# Patient Record
Sex: Male | Born: 1960 | Race: Black or African American | Hispanic: No | Marital: Married | Smoking: Never smoker
Health system: Southern US, Community
[De-identification: ages and names within clinical notes are randomized; demographics above are authoritative.]

## PROBLEM LIST (undated history)

## (undated) DIAGNOSIS — F419 Anxiety disorder, unspecified: Secondary | ICD-10-CM

## (undated) DIAGNOSIS — R7303 Prediabetes: Secondary | ICD-10-CM

## (undated) DIAGNOSIS — I10 Essential (primary) hypertension: Secondary | ICD-10-CM

## (undated) DIAGNOSIS — R6882 Decreased libido: Secondary | ICD-10-CM

## (undated) DIAGNOSIS — E785 Hyperlipidemia, unspecified: Secondary | ICD-10-CM

## (undated) HISTORY — DX: Hyperlipidemia, unspecified: E78.5

## (undated) HISTORY — DX: Anxiety disorder, unspecified: F41.9

## (undated) HISTORY — PX: KNEE SURGERY: SHX244

## (undated) HISTORY — PX: SHOULDER SURGERY: SHX246

## (undated) HISTORY — DX: Prediabetes: R73.03

---
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
## 1998-02-18 ENCOUNTER — Encounter: Admission: RE | Admit: 1998-02-18 | Discharge: 1998-02-18 | Payer: Self-pay | Admitting: *Deleted

## 2000-04-24 ENCOUNTER — Other Ambulatory Visit: Admission: RE | Admit: 2000-04-24 | Discharge: 2000-04-24 | Payer: Self-pay | Admitting: Orthopedic Surgery

## 2008-01-31 ENCOUNTER — Emergency Department (HOSPITAL_COMMUNITY): Admission: EM | Admit: 2008-01-31 | Discharge: 2008-02-01 | Payer: Self-pay | Admitting: Emergency Medicine

## 2010-05-18 ENCOUNTER — Inpatient Hospital Stay (INDEPENDENT_AMBULATORY_CARE_PROVIDER_SITE_OTHER)
Admission: RE | Admit: 2010-05-18 | Discharge: 2010-05-18 | Disposition: A | Discharge status: Home / Self Care | Payer: No Typology Code available for payment source | Source: Ambulatory Visit | Attending: Emergency Medicine | Admitting: Emergency Medicine

## 2010-05-18 ENCOUNTER — Ambulatory Visit (INDEPENDENT_AMBULATORY_CARE_PROVIDER_SITE_OTHER): Payer: No Typology Code available for payment source

## 2010-05-18 DIAGNOSIS — Z760 Encounter for issue of repeat prescription: Secondary | ICD-10-CM

## 2010-05-18 DIAGNOSIS — S5000XA Contusion of unspecified elbow, initial encounter: Secondary | ICD-10-CM

## 2010-05-18 DIAGNOSIS — I10 Essential (primary) hypertension: Secondary | ICD-10-CM

## 2010-05-18 DIAGNOSIS — S139XXA Sprain of joints and ligaments of unspecified parts of neck, initial encounter: Secondary | ICD-10-CM

## 2010-11-23 ENCOUNTER — Emergency Department (HOSPITAL_COMMUNITY): Payer: BC Managed Care – PPO

## 2010-11-23 ENCOUNTER — Emergency Department (HOSPITAL_COMMUNITY)
Admission: EM | Admit: 2010-11-23 | Discharge: 2010-11-23 | Disposition: A | Discharge status: Home / Self Care | Payer: BC Managed Care – PPO | Attending: Emergency Medicine | Admitting: Emergency Medicine

## 2010-11-23 DIAGNOSIS — R259 Unspecified abnormal involuntary movements: Secondary | ICD-10-CM | POA: Insufficient documentation

## 2010-11-23 DIAGNOSIS — R002 Palpitations: Secondary | ICD-10-CM | POA: Insufficient documentation

## 2010-11-23 DIAGNOSIS — I10 Essential (primary) hypertension: Secondary | ICD-10-CM | POA: Insufficient documentation

## 2010-11-23 DIAGNOSIS — R42 Dizziness and giddiness: Secondary | ICD-10-CM | POA: Insufficient documentation

## 2010-11-23 LAB — CBC
HCT: 42.2 % (ref 39.0–52.0)
Hemoglobin: 15.4 g/dL (ref 13.0–17.0)
MCV: 89.8 fL (ref 78.0–100.0)
Platelets: 235 10*3/uL (ref 150–400)
RBC: 4.7 MIL/uL (ref 4.22–5.81)
RDW: 12.1 % (ref 11.5–15.5)
WBC: 3.7 10*3/uL — ABNORMAL LOW (ref 4.0–10.5)

## 2010-11-23 LAB — COMPREHENSIVE METABOLIC PANEL
ALT: 16 U/L (ref 0–53)
AST: 21 U/L (ref 0–37)
Albumin: 3.9 g/dL (ref 3.5–5.2)
BUN: 7 mg/dL (ref 6–23)
Calcium: 8.8 mg/dL (ref 8.4–10.5)
Potassium: 3.6 mEq/L (ref 3.5–5.1)
Total Bilirubin: 0.7 mg/dL (ref 0.3–1.2)
Total Protein: 7.7 g/dL (ref 6.0–8.3)

## 2010-11-23 LAB — URINALYSIS, ROUTINE W REFLEX MICROSCOPIC
Leukocytes, UA: NEGATIVE
Urobilinogen, UA: 1 mg/dL (ref 0.0–1.0)

## 2010-11-23 LAB — DIFFERENTIAL
Eosinophils Absolute: 0.1 10*3/uL (ref 0.0–0.7)
Monocytes Absolute: 0.4 10*3/uL (ref 0.1–1.0)
Monocytes Relative: 11 % (ref 3–12)
Neutro Abs: 1.5 10*3/uL — ABNORMAL LOW (ref 1.7–7.7)

## 2010-11-23 LAB — CK TOTAL AND CKMB (NOT AT ARMC)
CK, MB: 1.8 ng/mL (ref 0.3–4.0)
Relative Index: 0.5 (ref 0.0–2.5)
Total CK: 370 U/L — ABNORMAL HIGH (ref 7–232)

## 2010-11-23 LAB — URINE MICROSCOPIC-ADD ON

## 2010-12-21 LAB — URINALYSIS, ROUTINE W REFLEX MICROSCOPIC
Glucose, UA: NEGATIVE
Ketones, ur: NEGATIVE
Leukocytes, UA: NEGATIVE
Protein, ur: NEGATIVE
Specific Gravity, Urine: 1.023
pH: 6

## 2010-12-21 LAB — CBC
MCHC: 34.1
Platelets: 251
RBC: 4.59
RDW: 12.6
WBC: 9.6

## 2010-12-21 LAB — BASIC METABOLIC PANEL
Chloride: 101
Creatinine, Ser: 1.54 — ABNORMAL HIGH
GFR calc Af Amer: 60 — ABNORMAL LOW
Glucose, Bld: 98
Potassium: 3.7

## 2010-12-21 LAB — DIFFERENTIAL
Basophils Relative: 1
Lymphs Abs: 1.4
Neutro Abs: 7.4
Neutrophils Relative %: 77

## 2011-07-26 ENCOUNTER — Ambulatory Visit: Payer: Worker's Compensation | Attending: Specialist | Admitting: Rehabilitative and Restorative Service Providers"

## 2011-07-26 DIAGNOSIS — IMO0001 Reserved for inherently not codable concepts without codable children: Secondary | ICD-10-CM | POA: Insufficient documentation

## 2011-07-26 DIAGNOSIS — M25569 Pain in unspecified knee: Secondary | ICD-10-CM | POA: Insufficient documentation

## 2011-07-26 DIAGNOSIS — M6281 Muscle weakness (generalized): Secondary | ICD-10-CM | POA: Insufficient documentation

## 2011-07-27 ENCOUNTER — Ambulatory Visit: Payer: Worker's Compensation | Admitting: Rehabilitative and Restorative Service Providers"

## 2011-08-02 ENCOUNTER — Ambulatory Visit: Payer: Worker's Compensation | Admitting: Rehabilitative and Restorative Service Providers"

## 2011-08-04 ENCOUNTER — Ambulatory Visit: Payer: Worker's Compensation | Attending: Specialist | Admitting: Physical Therapy

## 2011-08-04 DIAGNOSIS — M6281 Muscle weakness (generalized): Secondary | ICD-10-CM | POA: Insufficient documentation

## 2011-08-04 DIAGNOSIS — M25569 Pain in unspecified knee: Secondary | ICD-10-CM | POA: Insufficient documentation

## 2011-08-04 DIAGNOSIS — IMO0001 Reserved for inherently not codable concepts without codable children: Secondary | ICD-10-CM | POA: Insufficient documentation

## 2011-08-09 ENCOUNTER — Ambulatory Visit: Payer: Worker's Compensation | Attending: Specialist | Admitting: Rehabilitative and Restorative Service Providers"

## 2011-08-09 DIAGNOSIS — M6281 Muscle weakness (generalized): Secondary | ICD-10-CM | POA: Insufficient documentation

## 2011-08-09 DIAGNOSIS — IMO0001 Reserved for inherently not codable concepts without codable children: Secondary | ICD-10-CM | POA: Insufficient documentation

## 2011-08-11 ENCOUNTER — Ambulatory Visit: Payer: Self-pay | Attending: Specialist | Admitting: Rehabilitative and Restorative Service Providers"

## 2011-08-11 DIAGNOSIS — M25569 Pain in unspecified knee: Secondary | ICD-10-CM | POA: Insufficient documentation

## 2011-08-11 DIAGNOSIS — IMO0001 Reserved for inherently not codable concepts without codable children: Secondary | ICD-10-CM | POA: Insufficient documentation

## 2011-08-11 DIAGNOSIS — M6281 Muscle weakness (generalized): Secondary | ICD-10-CM | POA: Insufficient documentation

## 2011-08-18 ENCOUNTER — Ambulatory Visit: Payer: Worker's Compensation | Admitting: Physical Therapy

## 2011-08-23 ENCOUNTER — Ambulatory Visit: Payer: Worker's Compensation | Attending: Specialist | Admitting: Rehabilitative and Restorative Service Providers"

## 2011-08-23 DIAGNOSIS — IMO0001 Reserved for inherently not codable concepts without codable children: Secondary | ICD-10-CM | POA: Insufficient documentation

## 2011-08-23 DIAGNOSIS — M6281 Muscle weakness (generalized): Secondary | ICD-10-CM | POA: Insufficient documentation

## 2011-08-23 DIAGNOSIS — M25569 Pain in unspecified knee: Secondary | ICD-10-CM | POA: Insufficient documentation

## 2011-08-25 ENCOUNTER — Ambulatory Visit: Payer: Worker's Compensation | Admitting: Rehabilitative and Restorative Service Providers"

## 2011-08-30 ENCOUNTER — Encounter: Payer: Self-pay | Admitting: Physical Therapy

## 2011-09-01 ENCOUNTER — Encounter: Payer: Self-pay | Admitting: Physical Therapy

## 2014-10-02 DIAGNOSIS — E1169 Type 2 diabetes mellitus with other specified complication: Secondary | ICD-10-CM | POA: Insufficient documentation

## 2014-10-02 DIAGNOSIS — E785 Hyperlipidemia, unspecified: Secondary | ICD-10-CM

## 2014-10-02 DIAGNOSIS — R7303 Prediabetes: Secondary | ICD-10-CM

## 2014-10-02 HISTORY — DX: Hyperlipidemia, unspecified: E78.5

## 2014-10-02 HISTORY — DX: Prediabetes: R73.03

## 2015-10-12 ENCOUNTER — Telehealth: Payer: Self-pay | Admitting: Internal Medicine

## 2015-10-12 NOTE — Telephone Encounter (Signed)
Patient called and would like his Rx (Norvasc) to be refilled for his blood pressure.  He would like Rx called into [facility] on [address].

## 2015-10-12 NOTE — Telephone Encounter (Signed)
Patient would like his Rx (Norvasc) to be refilled for his blood pressure.  He would like Rx called into [facility] pharmacy on [address].

## 2015-10-13 NOTE — Telephone Encounter (Signed)
I don't see this in the med list. Could it have been so long ago that he is in [place]. If so, he needs an appointment and i'll pull his Jake Samples notes.

## 2015-10-14 ENCOUNTER — Other Ambulatory Visit: Payer: Self-pay

## 2015-10-14 DIAGNOSIS — I10 Essential (primary) hypertension: Secondary | ICD-10-CM

## 2015-10-14 MED ORDER — AMLODIPINE BESYLATE 10 MG PO TABS: 10.0000 mg | ORAL_TABLET | Freq: Every day | ORAL | 0 refills | Status: DC

## 2015-11-16 ENCOUNTER — Ambulatory Visit: Payer: Self-pay | Admitting: Internal Medicine

## 2015-12-31 ENCOUNTER — Emergency Department (HOSPITAL_COMMUNITY)
Admission: EM | Admit: 2015-12-31 | Discharge: 2015-12-31 | Disposition: A | Discharge status: Home / Self Care | Payer: Self-pay | Attending: Emergency Medicine | Admitting: Emergency Medicine

## 2015-12-31 ENCOUNTER — Emergency Department (HOSPITAL_COMMUNITY): Payer: Self-pay

## 2015-12-31 ENCOUNTER — Encounter (HOSPITAL_COMMUNITY): Payer: Self-pay | Admitting: Emergency Medicine

## 2015-12-31 DIAGNOSIS — N289 Disorder of kidney and ureter, unspecified: Secondary | ICD-10-CM | POA: Insufficient documentation

## 2015-12-31 DIAGNOSIS — R079 Chest pain, unspecified: Secondary | ICD-10-CM | POA: Insufficient documentation

## 2015-12-31 DIAGNOSIS — I10 Essential (primary) hypertension: Secondary | ICD-10-CM | POA: Insufficient documentation

## 2015-12-31 HISTORY — DX: Essential (primary) hypertension: I10

## 2015-12-31 LAB — I-STAT CHEM 8, ED
BUN: 7 mg/dL (ref 6–20)
CHLORIDE: 102 mmol/L (ref 101–111)
CREATININE: 1.4 mg/dL — AB (ref 0.61–1.24)
Calcium, Ion: 1.05 mmol/L — ABNORMAL LOW (ref 1.15–1.40)
Glucose, Bld: 115 mg/dL — ABNORMAL HIGH (ref 65–99)
HEMATOCRIT: 40 % (ref 39.0–52.0)
Hemoglobin: 13.6 g/dL (ref 13.0–17.0)
POTASSIUM: 3.8 mmol/L (ref 3.5–5.1)
Sodium: 138 mmol/L (ref 135–145)
TCO2: 25 mmol/L (ref 0–100)

## 2015-12-31 LAB — CBC WITH DIFFERENTIAL/PLATELET
Basophils Absolute: 0 10*3/uL (ref 0.0–0.1)
Basophils Relative: 0 %
EOS PCT: 1 %
Eosinophils Absolute: 0 10*3/uL (ref 0.0–0.7)
HCT: 38.5 % — ABNORMAL LOW (ref 39.0–52.0)
HEMOGLOBIN: 13.9 g/dL (ref 13.0–17.0)
LYMPHS ABS: 2.4 10*3/uL (ref 0.7–4.0)
LYMPHS PCT: 47 %
MCH: 33.3 pg (ref 26.0–34.0)
MCHC: 36.1 g/dL — ABNORMAL HIGH (ref 30.0–36.0)
MCV: 92.1 fL (ref 78.0–100.0)
Monocytes Absolute: 0.6 10*3/uL (ref 0.1–1.0)
Monocytes Relative: 12 %
Neutro Abs: 2.1 10*3/uL (ref 1.7–7.7)
Neutrophils Relative %: 40 %
PLATELETS: 236 10*3/uL (ref 150–400)
RBC: 4.18 MIL/uL — AB (ref 4.22–5.81)
RDW: 12.5 % (ref 11.5–15.5)
WBC: 5.2 10*3/uL (ref 4.0–10.5)

## 2015-12-31 LAB — I-STAT TROPONIN, ED: Troponin i, poc: 0 ng/mL (ref 0.00–0.08)

## 2015-12-31 MED ORDER — AMLODIPINE BESYLATE 10 MG PO TABS: 10.0000 mg | ORAL_TABLET | Freq: Every day | ORAL | 0 refills | Status: DC

## 2015-12-31 NOTE — ED Provider Notes (Signed)
[facility]ER[facility]ENCY DEPT Provider Note   CSN: [id number] Arrival date & time: 12/31/15  [id number]     History   Chief Complaint Chief Complaint  Patient presents with  . Chest Pain    HPI Corey Mcguire is a 55 y.o. male.  HPI Patient presents with right-sided chest pain. States she woke up with a cramping feeling in his right pec muscle. It cramps and then goes away. As no fevers. No shortness of breath. He has not had pains before. Nothing different physically yesterday. No nausea. He has not had anything this before. No history coronary artery disease. He does not smoke. No drug use. Father had heart attacks but it was in his early 6960s.   Past Medical History:  Diagnosis Date  . Hypertension     There are no active problems to display for this patient.   Past Surgical History:  Procedure Laterality Date  . KNEE SURGERY    . SHOULDER SURGERY         Home Medications    Prior to Admission medications   Medication Sig Start Date End Date Taking? Authorizing Provider  amLODipine (NORVASC) 10 MG tablet Take 1 tablet (10 mg total) by mouth daily. 12/31/15   Benjiman CoreNathan Bayden Gil, MD    Family History History reviewed. No pertinent family history.  Social History Social History  Substance Use Topics  . Smoking status: Never Smoker  . Smokeless tobacco: Current User    Types: Chew  . Alcohol use 6.0 - 8.4 oz/week    10 - 14 Cans of beer per week     Allergies   Codeine   Review of Systems Review of Systems  Constitutional: Negative for activity change and appetite change.  Eyes: Negative for pain.  Respiratory: Negative for chest tightness and shortness of breath.   Cardiovascular: Positive for chest pain. Negative for leg swelling.  Gastrointestinal: Negative for abdominal pain, diarrhea, nausea and vomiting.  Genitourinary: Negative for flank pain.  Musculoskeletal: Negative for back pain and neck stiffness.  Skin: Negative for rash.  Neurological: Negative  for weakness, numbness and headaches.  Psychiatric/Behavioral: Negative for behavioral problems.     Physical Exam Updated Vital Signs BP (!) 183/115 (BP Location: Right Arm)   Pulse 76   Temp 98.3 F (36.8 C) (Oral)   Resp 19   Ht 5\' 7"  (1.702 m)   SpO2 100%   Physical Exam  Constitutional: He is oriented to person, place, and time. He appears well-developed and well-nourished.  HENT:  Head: Normocephalic and atraumatic.  Eyes: Pupils are equal, round, and reactive to light.  Neck: Neck supple.  Cardiovascular: Normal rate and regular rhythm.   No murmur heard. Pulmonary/Chest: Effort normal and breath sounds normal. He exhibits no tenderness.  Abdominal: Soft. Bowel sounds are normal. He exhibits no distension and no mass. There is no tenderness. There is no rebound and no guarding.  Musculoskeletal: Normal range of motion. He exhibits no edema.  Neurological: He is alert and oriented to person, place, and time. No cranial nerve deficit.  Skin: Skin is warm and dry.  Psychiatric: He has a normal mood and affect.  Nursing note and vitals reviewed.    ED Treatments / Results  Labs (all labs ordered are listed, but only abnormal results are displayed) Labs Reviewed  CBC WITH DIFFERENTIAL/PLATELET - Abnormal; Notable for the following:       Result Value   RBC 4.18 (*)    HCT 38.5 (*)  MCHC 36.1 (*)    All other components within normal limits  I-STAT CHEM 8, ED - Abnormal; Notable for the following:    Creatinine, Ser 1.40 (*)    Glucose, Bld 115 (*)    Calcium, Ion 1.05 (*)    All other components within normal limits  I-STAT TROPOININ, ED    EKG  EKG Interpretation  Date/Time:  Thursday December 31 2015 06:48:44 EDT Ventricular Rate:  77 PR Interval:    QRS Duration: 79 QT Interval:  379 QTC Calculation: 429 R Axis:   52 Text Interpretation:  Sinus rhythm Borderline T wave abnormalities No significant change since last tracing Reconfirmed by Rubin Payor   MD, Harrold Donath [phone]) on 12/31/2015 7:00:15 AM       Radiology Dg Chest 2 View  Result Date: 12/31/2015 CLINICAL DATA:  Patient with right-sided chest spasms. EXAM: CHEST  2 VIEW COMPARISON:  None. FINDINGS: The heart size and mediastinal contours are within normal limits. Both lungs are clear. The visualized skeletal structures are unremarkable. IMPRESSION: No active cardiopulmonary disease. Electronically Signed   By: Annia Belt M.D.   On: 12/31/2015 07:56    Procedures Procedures (including critical care time)  Medications Ordered in ED Medications - No data to display   Initial Impression / Assessment and Plan / ED Course  I have reviewed the triage vital signs and the nursing notes.  Pertinent labs & imaging results that were available during my care of the patient were reviewed by me and considered in my medical decision making (see chart for details).  Clinical Course  Patient with chest pain. EKG reassuring. Story really not that worrisome. He does however have hypertension has been off his medicines. Some mild renal insufficiency. His been off his medicines for months and I think this is likely chronic and not acute. Will start back on medicines and have follow-up with his primary care doctor. States he has insurance starting the first severe but told to follow-up should be sooner than that. Discharge home.  Final Clinical Impressions(s) / ED Diagnoses   Final diagnoses:  Nonspecific chest pain  Essential hypertension  Renal insufficiency    New Prescriptions New Prescriptions   AMLODIPINE (NORVASC) 10 MG TABLET    Take 1 tablet (10 mg total) by mouth daily.     Benjiman Core, MD 12/31/15 [phone]

## 2015-12-31 NOTE — ED Triage Notes (Signed)
Patient with chest pain that woke him from sleep this am.  He denies any shortness of breath, nausea or vomiting.  No diaphoresis. Patient states that it is in the right chest, no radiation.

## 2016-01-05 ENCOUNTER — Ambulatory Visit (INDEPENDENT_AMBULATORY_CARE_PROVIDER_SITE_OTHER): Payer: Self-pay | Admitting: Internal Medicine

## 2016-01-05 ENCOUNTER — Encounter: Payer: Self-pay | Admitting: Internal Medicine

## 2016-01-05 VITALS — BP 158/96 | HR 78 | Ht 66.5 in | Wt 220.0 lb

## 2016-01-05 DIAGNOSIS — E7800 Pure hypercholesterolemia, unspecified: Secondary | ICD-10-CM

## 2016-01-05 DIAGNOSIS — R7303 Prediabetes: Secondary | ICD-10-CM

## 2016-01-05 DIAGNOSIS — I10 Essential (primary) hypertension: Secondary | ICD-10-CM | POA: Insufficient documentation

## 2016-01-05 DIAGNOSIS — Z23 Encounter for immunization: Secondary | ICD-10-CM

## 2016-01-05 MED ORDER — AMLODIPINE BESYLATE 10 MG PO TABS: 10.0000 mg | ORAL_TABLET | Freq: Every day | ORAL | 11 refills | Status: DC

## 2016-01-05 NOTE — Progress Notes (Signed)
Subjective:    Patient ID: Corey Mcguire, male    DOB: May 18, 1960, 55 y.o.   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   1.  Essential Hypertension:  Out of Amlodipine for 2 months as forgot to sign up for [employer] with new job during enrollment period and cannot do so now until 03/2016.  Seen at ED on 10/12 with right pectoral muscle pain.  EKG and Troponin were fine.  Creatinine was up to 1.4.  BP was not well controlled.  Restarted on  Amlodipine. Lab Results  Component Value Date   CREATININE 1.40 (H) 12/31/2015   CREATININE 1.17 11/23/2010   CREATININE 1.54 (H) 01/31/2008   Missed Amlodipine this morning as was rushing to get grandkids to school. Not stayinig physically active.  2.  Panic Disorder/Anxiety:  No anxiety attacks since working at [employer].  States he has been full time there for past year--this is his second year.  Chart Review of [facility]:  4.  Hypercholesterolemia with very high total of 330,  HDL at 89 and LDL 219. In July of 2016.  Patient did not follow up thereafter to discuss  5.  Prediabetes:  A1C of 5.8% July of 2016, again no follow up  Current Meds  Medication Sig  . amLODipine (NORVASC) 10 MG tablet Take 1 tablet (10 mg total) by mouth daily.  . [DISCONTINUED] amLODipine (NORVASC) 10 MG tablet Take 1 tablet (10 mg total) by mouth daily.   Allergies  Allergen Reactions  . Codeine Hives and Nausea Only    Past Medical History:  Diagnosis Date  . Anxiety    History of panic attacks 2016--improved with employment  . Hyperlipidemia 10/02/2014   [facility]:  Total:  330, Trigs:  109, HDL:  89,  LDL:  219.  did not follow up afterward  . Hypertension   . Prediabetes 10/02/2014   A1C:  5.8%  did not follow up   Past Surgical History:  Procedure Laterality Date  . KNEE SURGERY    . SHOULDER SURGERY      Family History  Problem Relation Age of Onset  .  Hypertension Mother   . Diabetes Father   . Heart disease Father     Quadruple bypass    Social History   Social History  . Marital status: Married    Spouse name: N/A  . Number of children: N/A  . Years of education: college grad   Occupational History  . [employer]    Kindergarten teacher at [employer]   Social History Main Topics  . Smoking status: Never Smoker  . Smokeless tobacco: Current User    Types: Chew     Comment: Chewed tobacco since age 55 yo  . Alcohol use 6.0 - 8.4 oz/week    10 - 14 Cans of beer per week  . Drug use: No  . Sexual activity: Not on file   Other Topics Concern  . Not on file   Social History Narrative   Lives at home with wife      Review of Systems     Objective:   Physical Exam  Weight appears up a bit HEENT:  PERRL, EOMI, throat without injection Neck:  Supple, no adenopathy, no thyromegaly Chest:  CTA CV:  RRR with normal S1 and S2, No S3, S4 or murmur.  No carotid bruits.  Carotid, radial and DP pulses  normal and equal LE:  No edema Abd:  S, NT, No HSM or mass, + BS        Assessment & Plan:  1.  Essential Hypertension:  Patient has redeveloped some renal insufficiency with poor control of BP.   Discussed at length need for consistency with taking Amlodipine daily at about the same time. Discussed mildly abnormal kidney function likely due to poor bp control. Refilled med for year. Encouraged regular physical activity and healthy eating. Followup in 1 month for bp and BMP.  2.  Hypercholesterolemia:  Found this in old chart--will discuss at next visit.  3.  Prediabetes:  Found this in old chart--will discuss at next visit.

## 2016-01-06 ENCOUNTER — Encounter: Payer: Self-pay | Admitting: Internal Medicine

## 2016-02-02 ENCOUNTER — Ambulatory Visit: Payer: Self-pay | Admitting: Internal Medicine

## 2016-02-29 ENCOUNTER — Ambulatory Visit: Payer: Self-pay | Admitting: Internal Medicine

## 2016-12-15 ENCOUNTER — Other Ambulatory Visit: Payer: Self-pay | Admitting: Internal Medicine

## 2017-12-16 ENCOUNTER — Other Ambulatory Visit: Payer: Self-pay | Admitting: Internal Medicine

## 2018-11-12 ENCOUNTER — Encounter: Payer: Self-pay | Admitting: Internal Medicine

## 2018-11-12 ENCOUNTER — Ambulatory Visit: Payer: Self-pay | Admitting: Internal Medicine

## 2018-11-12 ENCOUNTER — Other Ambulatory Visit: Payer: Self-pay

## 2018-11-12 VITALS — BP 110/64 | HR 83 | Temp 98.3°F | Ht 67.0 in | Wt 226.6 lb

## 2018-11-12 DIAGNOSIS — R7989 Other specified abnormal findings of blood chemistry: Secondary | ICD-10-CM | POA: Insufficient documentation

## 2018-11-12 DIAGNOSIS — E559 Vitamin D deficiency, unspecified: Secondary | ICD-10-CM

## 2018-11-12 DIAGNOSIS — R5383 Other fatigue: Secondary | ICD-10-CM | POA: Insufficient documentation

## 2018-11-12 NOTE — Patient Instructions (Addendum)
-   Please stop by the lab one morning between 7:30-8 AM ( fasting )  - Please take over the counter Vitamin D 3 2000 iu daily    - Please discuss fatigue and snoring with your Primary care provider as you are at risk for obstructive sleep apnea

## 2018-11-12 NOTE — Progress Notes (Signed)
Name: Corey Mcguire  MRN/ DOB: [id number], [birth year]    Age/ Sex: 58 y.o., male    PCP: Julieanne MansonMulberry, Elizabeth, MD   Reason for Endocrinology Evaluation: Low testosterone      Date of Initial Endocrinology Evaluation: 11/12/2018     HPI: Corey Mcguire is a 58 y.o. male with a past medical history of HTN, pre-diabetes  . The patient presented for initial endocrinology clinic visit on 11/12/2018 for consultative assistance with his low testosterone    Pt with decreased libido for the past 6 months, denies early morning and middle of night erections, he does endorse well  ED .    Had mumps in his early teens.    Denies decreased shaving  Decreases testicular and penile size over the past month   NO OTC medications   Snores at night ad feels tired.    No gynecomastia   No nipple discharge  Has periodic headaches but no change in vision.    Has been married for 31 yrs Has 3 biological kids 27,28, 2330   He is not a smoker  No FH of prostate cancer    HISTORY:  Past Medical History:  Past Medical History:  Diagnosis Date  . Anxiety    History of panic attacks 2016--improved with employment  . Hyperlipidemia 10/02/2014   [facility]:  Total:  330, Trigs:  109, HDL:  89,  LDL:  219.  did not follow up afterward  . Hypertension   . Prediabetes 10/02/2014   A1C:  5.8%  did not follow up   Past Surgical History:  Past Surgical History:  Procedure Laterality Date  . KNEE SURGERY    . SHOULDER SURGERY        Social History:  reports that he has never smoked. His smokeless tobacco use includes chew. He reports current alcohol use of about 10.0 - 14.0 standard drinks of alcohol per week. He reports that he does not use drugs.  Family History: family history includes Diabetes in his father; Heart disease in his father; Hypertension in his mother.   HOME MEDICATIONS: Allergies as of 11/12/2018      Reactions   Codeine Hives, Nausea Only       Medication List       Accurate as of November 12, 2018  2:24 PM. If you have any questions, ask your nurse or doctor.        amLODipine 10 MG tablet Commonly known as: NORVASC TAKE ONE TABLET BY MOUTH DAILY   metFORMIN 500 MG 24 hr tablet Commonly known as: GLUCOPHAGE-XR   omeprazole 40 MG capsule Commonly known as: PRILOSEC   rosuvastatin 20 MG tablet Commonly known as: CRESTOR   valsartan-hydrochlorothiazide 320-25 MG tablet Commonly known as: DIOVAN-HCT         REVIEW OF SYSTEMS: A comprehensive ROS was conducted with the patient and is negative except as per HPI and below:  Review of Systems  Constitutional: Positive for malaise/fatigue. Negative for fever and weight loss.  HENT: Negative for congestion and sore throat.   Eyes: Negative for blurred vision and pain.  Respiratory: Negative for cough and shortness of breath.   Cardiovascular: Negative for chest pain and palpitations.  Gastrointestinal: Negative for diarrhea and nausea.  Genitourinary: Negative for frequency.  Neurological: Negative for tingling and tremors.  Endo/Heme/Allergies: Negative for polydipsia.  Psychiatric/Behavioral: Negative for depression. The patient has insomnia. The patient is not nervous/anxious.  OBJECTIVE:  VS: BP 110/64 (BP Location: Left Arm, Patient Position: Sitting, Cuff Size: Large)   Pulse 83   Temp 98.3 F (36.8 C)   Ht 5\' 7"  (1.702 m)   Wt 226 lb 9.6 oz (102.8 kg)   SpO2 98%   BMI 35.49 kg/m    Wt Readings from Last 3 Encounters:  11/12/18 226 lb 9.6 oz (102.8 kg)  01/05/16 220 lb (99.8 kg)     EXAM: General: Pt appears well and is in NAD  Hydration: Well-hydrated with moist mucous membranes and good skin turgor  Eyes: External eye exam normal without stare, lid lag or exophthalmos.  EOM intact.  PERRL.  Ears, Nose, Throat: Hearing: Grossly intact bilaterally Dental: Good dentition  Throat: Clear without mass, erythema or exudate  Neck: General:  Supple without adenopathy. Thyroid: Thyroid size normal.   Lungs: Clear with good BS bilat with no rales, rhonchi, or wheezes  Heart: Auscultation: RRR.  Abdomen: Normoactive bowel sounds, soft, nontender, without masses or organomegaly palpable  Genital: Normal penile and testicular exam, Right 15 Ml, left 20 mL   Extremities:  BL LE: No pretibial edema normal ROM and strength.   Skin: Hair: Texture and amount normal with gender appropriate distribution Skin Inspection: No rashes Skin Palpation: Skin temperature, texture, and thickness normal to palpation  Neuro: Cranial nerves: II - XII grossly intact  Motor: Normal strength throughout DTRs: 2+ and symmetric in UE without delay in relaxation phase  Mental Status: Judgment, insight: Intact Orientation: Oriented to time, place, and person Mood and affect: No depression, anxiety, or agitation     DATA REVIEWED: 10/05/2018  13:12    Testosterone 275.6 ng/dL    1/61/096/12/20 6:049:32 AM  540.9308.3 ng/dL    8/11/91475/13/2020  Testosterone 271.2 ng/dL     TSH 8.291.09 uIU/mL    Vitamin D 5.3 ng/mL   ASSESSMENT/PLAN/RECOMMENDATIONS:   1. Decreased Libido :   - Differential diagnosis could be endocrine vs psychological, erectile dysfunction could be endocrine, psychological vs vascular.  - We discussed the Endocrine Society guidelines in the proper way of checking testosterone, pt has to be fasting and will present for testing at 8 AM. - We discussed side effects of testosterone therapy such as erythropoiesis, worsening of sleep apnea that is severe and untreated,  Prostate volumes and serum PSA increase in response to testosterone treatment which might increase BPH and worsen urinary outflow obstruction as well as prostate cancer risk.  - We also discussed there is a possibility of increased cardiovascular risk associated with testosterone use.    2. Vitamin D Deficiency :   - We discussed the role of vitamin D in muscle and bone health, we  discussed how it could improve energy. Pt at risk for osteomalacia if not properly treated, he took replacement for one month but stopped when the prescription ran out.   - Pt advised to take OTC Vit D3 2000 iu daily and have that rechecked in 3 months at [facility].    3. Fatigue :  - Multifactorial - Given obesity, snoring and fatigue, he is at a high risk for OSA, I have encouraged him to address this with his PCP.  - We discussed the risk of CVD, and HTN with untreated OSA    F/U pending lab results.   Signed electronically by: Lyndle HerrlichAbby Jaralla Georgean Spainhower, MD  [facility]  [facility] [address] [place], [place] [address] Phone: [phone] FAX: [phone]  CC: Mack Hook, MD [place] [address] Phone: [phone] Fax: [phone]   Return to Endocrinology clinic as below: No future appointments.

## 2018-11-13 ENCOUNTER — Encounter: Payer: Self-pay | Admitting: Internal Medicine

## 2018-11-14 ENCOUNTER — Other Ambulatory Visit: Payer: Self-pay | Admitting: Internal Medicine

## 2018-11-14 ENCOUNTER — Other Ambulatory Visit: Payer: Self-pay

## 2018-11-14 ENCOUNTER — Telehealth: Payer: Self-pay | Admitting: Internal Medicine

## 2018-11-14 ENCOUNTER — Other Ambulatory Visit (INDEPENDENT_AMBULATORY_CARE_PROVIDER_SITE_OTHER): Payer: BC Managed Care – PPO

## 2018-11-14 DIAGNOSIS — R7989 Other specified abnormal findings of blood chemistry: Secondary | ICD-10-CM

## 2018-11-14 LAB — CBC WITH DIFFERENTIAL/PLATELET
Basophils Absolute: 0 10*3/uL (ref 0.0–0.1)
Basophils Relative: 0.3 % (ref 0.0–3.0)
Eosinophils Absolute: 0.1 10*3/uL (ref 0.0–0.7)
Eosinophils Relative: 0.7 % (ref 0.0–5.0)
HCT: 32.6 % — ABNORMAL LOW (ref 39.0–52.0)
Hemoglobin: 11.6 g/dL — ABNORMAL LOW (ref 13.0–17.0)
Lymphocytes Relative: 38.1 % (ref 12.0–46.0)
Lymphs Abs: 3.3 10*3/uL (ref 0.7–4.0)
MCHC: 35.6 g/dL (ref 30.0–36.0)
MCV: 92.9 fl (ref 78.0–100.0)
Monocytes Absolute: 1 10*3/uL (ref 0.1–1.0)
Monocytes Relative: 11.4 % (ref 3.0–12.0)
Neutro Abs: 4.3 10*3/uL (ref 1.4–7.7)
Neutrophils Relative %: 49.5 % (ref 43.0–77.0)
Platelets: 321 10*3/uL (ref 150.0–400.0)
RBC: 3.51 Mil/uL — ABNORMAL LOW (ref 4.22–5.81)
RDW: 14.2 % (ref 11.5–15.5)
WBC: 8.6 10*3/uL (ref 4.0–10.5)

## 2018-11-14 LAB — COMPREHENSIVE METABOLIC PANEL
ALT: 24 U/L (ref 0–53)
AST: 36 U/L (ref 0–37)
Albumin: 4.2 g/dL (ref 3.5–5.2)
Alkaline Phosphatase: 53 U/L (ref 39–117)
BUN: 10 mg/dL (ref 6–23)
CO2: 38 mEq/L — ABNORMAL HIGH (ref 19–32)
Calcium: 9.2 mg/dL (ref 8.4–10.5)
Chloride: 88 mEq/L — ABNORMAL LOW (ref 96–112)
Creatinine, Ser: 2.07 mg/dL — ABNORMAL HIGH (ref 0.40–1.50)
GFR: 40.44 mL/min — ABNORMAL LOW (ref >60.00)
Glucose, Bld: 165 mg/dL — ABNORMAL HIGH (ref 70–99)
Potassium: 2.6 mEq/L — CL (ref 3.5–5.1)
Sodium: 139 mEq/L (ref 135–145)
Total Bilirubin: 1.1 mg/dL (ref 0.2–1.2)
Total Protein: 7.3 g/dL (ref 6.0–8.3)

## 2018-11-14 LAB — PSA: PSA: 0.96 ng/mL (ref 0.10–4.00)

## 2018-11-14 LAB — LUTEINIZING HORMONE: LH: 8.64 m[IU]/mL (ref 1.50–9.30)

## 2018-11-14 LAB — TSH: TSH: 1.31 u[IU]/mL (ref 0.35–4.50)

## 2018-11-14 LAB — T4, FREE: Free T4: 1.34 ng/dL (ref 0.60–1.60)

## 2018-11-14 LAB — FOLLICLE STIMULATING HORMONE: FSH: 7 m[IU]/mL (ref 1.4–18.1)

## 2018-11-14 MED ORDER — POTASSIUM CHLORIDE ER 10 MEQ PO CPCR: 10.0000 meq | ORAL_CAPSULE | Freq: Two times a day (BID) | ORAL | 0 refills | Status: DC

## 2018-11-14 NOTE — Telephone Encounter (Signed)
Pt aware of results and stated that he would pick up medication and make f/u with PCP

## 2018-11-14 NOTE — Telephone Encounter (Signed)
Results were faxed conformation recieved

## 2018-11-14 NOTE — Telephone Encounter (Signed)
Please let him know his potassium is very low, this is most likely coming for his water pill (Hydrochlorothiazide)   I am still waiting on testosterone results, this will take 10 days or so  Please let him know that I sent a prescription of potassium tablets to be taken twice a day for 1 week , he needs to schedule an appointment with his PCP ASAP for follow up on the potassium and to discuss medication change.    He is anemic, his kidney function is low ( but I am not clear if this is his baseline or not ) Please fax the labs to his PCP .     Thank you   Abby Nena Jordan, MD  [facility]  [facility] [address] [place], [place] [address] Phone: [phone] FAX: [phone]

## 2018-11-17 LAB — PROLACTIN: Prolactin: 13 ng/mL (ref 2.0–18.0)

## 2018-11-17 LAB — TESTOSTERONE, TOTAL, LC/MS/MS: Testosterone, Total, LC-MS-MS: 298 ng/dL (ref 250–1100)

## 2018-11-20 ENCOUNTER — Encounter: Payer: Self-pay | Admitting: Internal Medicine

## 2018-11-20 LAB — TESTOSTERONE FREE MS/DIALYSIS
Free Testosterone, Serum: 67 pg/mL
Testosterone, Serum (Total): 291 ng/dL
Testosterone-% Free: 2.3 %

## 2018-11-26 ENCOUNTER — Other Ambulatory Visit: Payer: Self-pay | Admitting: Internal Medicine

## 2018-11-27 NOTE — Telephone Encounter (Signed)
You gave pt 15 day supply,is this something you want him for longer? Please advise

## 2018-12-26 ENCOUNTER — Encounter: Payer: Self-pay | Admitting: Internal Medicine

## 2018-12-26 DIAGNOSIS — R6882 Decreased libido: Secondary | ICD-10-CM | POA: Insufficient documentation

## 2018-12-26 HISTORY — DX: Decreased libido: R68.82

## 2019-04-07 ENCOUNTER — Other Ambulatory Visit: Payer: Self-pay

## 2019-04-07 ENCOUNTER — Emergency Department (HOSPITAL_COMMUNITY)
Admission: EM | Admit: 2019-04-07 | Discharge: 2019-04-08 | Disposition: A | Discharge status: Home / Self Care | Payer: BC Managed Care – PPO | Attending: Emergency Medicine | Admitting: Emergency Medicine

## 2019-04-07 ENCOUNTER — Encounter (HOSPITAL_COMMUNITY): Payer: Self-pay | Admitting: *Deleted

## 2019-04-07 DIAGNOSIS — I10 Essential (primary) hypertension: Secondary | ICD-10-CM | POA: Insufficient documentation

## 2019-04-07 DIAGNOSIS — Z79899 Other long term (current) drug therapy: Secondary | ICD-10-CM | POA: Diagnosis not present

## 2019-04-07 DIAGNOSIS — N179 Acute kidney failure, unspecified: Secondary | ICD-10-CM | POA: Insufficient documentation

## 2019-04-07 DIAGNOSIS — Z720 Tobacco use: Secondary | ICD-10-CM | POA: Insufficient documentation

## 2019-04-07 DIAGNOSIS — K219 Gastro-esophageal reflux disease without esophagitis: Secondary | ICD-10-CM | POA: Insufficient documentation

## 2019-04-07 DIAGNOSIS — R112 Nausea with vomiting, unspecified: Secondary | ICD-10-CM

## 2019-04-07 DIAGNOSIS — R1110 Vomiting, unspecified: Secondary | ICD-10-CM | POA: Diagnosis present

## 2019-04-07 NOTE — ED Triage Notes (Signed)
2112   The pt is here for high blood pressure all day with some vomiting also

## 2019-04-08 ENCOUNTER — Other Ambulatory Visit: Payer: Self-pay

## 2019-04-08 LAB — CBC WITH DIFFERENTIAL/PLATELET
Abs Immature Granulocytes: 0.04 10*3/uL (ref 0.00–0.07)
Basophils Absolute: 0 10*3/uL (ref 0.0–0.1)
Basophils Relative: 0 %
Eosinophils Absolute: 0 10*3/uL (ref 0.0–0.5)
Eosinophils Relative: 0 %
HCT: 36.2 % — ABNORMAL LOW (ref 39.0–52.0)
Hemoglobin: 12.6 g/dL — ABNORMAL LOW (ref 13.0–17.0)
Immature Granulocytes: 0 %
Lymphocytes Relative: 23 %
Lymphs Abs: 2.2 10*3/uL (ref 0.7–4.0)
MCH: 32.6 pg (ref 26.0–34.0)
MCHC: 34.8 g/dL (ref 30.0–36.0)
MCV: 93.8 fL (ref 80.0–100.0)
Monocytes Absolute: 0.9 10*3/uL (ref 0.1–1.0)
Monocytes Relative: 9 %
Neutro Abs: 6.7 10*3/uL (ref 1.7–7.7)
Neutrophils Relative %: 68 %
Platelets: 299 10*3/uL (ref 150–400)
RBC: 3.86 MIL/uL — ABNORMAL LOW (ref 4.22–5.81)
RDW: 13.2 % (ref 11.5–15.5)
WBC: 9.9 10*3/uL (ref 4.0–10.5)
nRBC: 0 % (ref 0.0–0.2)

## 2019-04-08 LAB — BASIC METABOLIC PANEL
Anion gap: 13 (ref 5–15)
BUN: 14 mg/dL (ref 6–20)
CO2: 23 mmol/L (ref 22–32)
Calcium: 9.5 mg/dL (ref 8.9–10.3)
Chloride: 101 mmol/L (ref 98–111)
Creatinine, Ser: 2.26 mg/dL — ABNORMAL HIGH (ref 0.61–1.24)
GFR calc Af Amer: 36 mL/min — ABNORMAL LOW (ref >60)
GFR calc non Af Amer: 31 mL/min — ABNORMAL LOW (ref >60)
Glucose, Bld: 120 mg/dL — ABNORMAL HIGH (ref 70–99)
Potassium: 4 mmol/L (ref 3.5–5.1)
Sodium: 137 mmol/L (ref 135–145)

## 2019-04-08 MED ORDER — ALUM & MAG HYDROXIDE-SIMETH 200-200-20 MG/5ML PO SUSP
30.0000 mL | Freq: Once | ORAL | Status: AC
  Administered 2019-04-08: 04:00:00 30 mL via ORAL
  Filled 2019-04-08: qty 30

## 2019-04-08 MED ORDER — SODIUM CHLORIDE 0.9 % IV BOLUS
1000.0000 mL | Freq: Once | INTRAVENOUS | Status: AC
  Administered 2019-04-08: 04:00:00 1000 mL via INTRAVENOUS

## 2019-04-08 NOTE — ED Provider Notes (Signed)
[facility] EMERGENCY DEPARTMENT Provider Note  CSN: [id number] Arrival date & time: 04/07/19 2111  Chief Complaint(s) Hypertension  HPI Corey Mcguire is a 59 y.o. male   The history is provided by the patient.  Emesis Severity:  Moderate Duration:  2 hours Number of daily episodes:  2 Quality:  Stomach contents Progression:  Resolved Chronicity:  New Relieved by:  Nothing Worsened by:  Nothing Associated symptoms: no abdominal pain, no chills, no cough, no diarrhea, no fever, no headaches, no myalgias, no sore throat and no URI   Risk factors: no alcohol use, no sick contacts and no suspect food intake     Patient reports that he noted his BP elevated to 200s after this episode.  Past Medical History Past Medical History:  Diagnosis Date  . Anxiety    History of panic attacks 2016--improved with employment  . Hyperlipidemia 10/02/2014   Labs in [facility]:  Total:  330, Trigs:  109, HDL:  89,  LDL:  219.  did not follow up afterward  . Hypertension   . Libido, decreased 12/26/2018  . Prediabetes 10/02/2014   A1C:  5.8%  did not follow up   Patient Active Problem List   Diagnosis Date Noted  . Libido, decreased 12/26/2018  . Vitamin D deficiency 11/12/2018  . Fatigue 11/12/2018  . Low testosterone in male 11/12/2018  . Essential hypertension 01/05/2016  . Prediabetes 10/02/2014  . Hyperlipidemia 10/02/2014   Home Medication(s) Prior to Admission medications   Medication Sig Start Date End Date Taking? Authorizing Provider  amLODipine (NORVASC) 10 MG tablet TAKE ONE TABLET BY MOUTH DAILY 12/16/16   Julieanne Manson, MD  metFORMIN (GLUCOPHAGE-XR) 500 MG 24 hr tablet  09/05/18   [provider]  omeprazole (PRILOSEC) 40 MG capsule  10/24/18   [provider]  potassium chloride (MICRO-K) 10 MEQ CR capsule Take 1 capsule (10 mEq total) by mouth 2 (two) times daily. 11/14/18   Shamleffer, Konrad Dolores, MD  rosuvastatin  (CRESTOR) 20 MG tablet  08/18/18   [provider]  valsartan-hydrochlorothiazide (DIOVAN-HCT) 320-25 MG tablet  08/01/18   [provider]                                                                                                                                    Past Surgical History Past Surgical History:  Procedure Laterality Date  . KNEE SURGERY    . SHOULDER SURGERY     Family History Family History  Problem Relation Age of Onset  . Hypertension Mother   . Diabetes Father   . Heart disease Father        Quadruple bypass    Social History Social History   Tobacco Use  . Smoking status: Never Smoker  . Smokeless tobacco: Current User    Types: Chew  . Tobacco comment: Chewed tobacco since age 39 yo  Substance Use Topics  .  Alcohol use: Yes    Alcohol/week: 10.0 - 14.0 standard drinks    Types: 10 - 14 Cans of beer per week  . Drug use: No   Allergies Codeine  Review of Systems Review of Systems  Constitutional: Negative for chills and fever.  HENT: Negative for sore throat.   Respiratory: Negative for cough.   Gastrointestinal: Positive for vomiting. Negative for abdominal pain and diarrhea.  Musculoskeletal: Negative for myalgias.  Neurological: Negative for headaches.   All other systems are reviewed and are negative for acute change except as noted in the HPI  Physical Exam Vital Signs  I have reviewed the triage vital signs BP (!) 156/86   Pulse 83   Temp 98.1 F (36.7 C)   Resp 18   Ht 5\' 7"  (1.702 m)   Wt 99.8 kg   SpO2 99%   BMI 34.46 kg/m   Physical Exam Vitals reviewed.  Constitutional:      General: He is not in acute distress.    Appearance: He is well-developed. He is not diaphoretic.  HENT:     Head: Normocephalic and atraumatic.     Nose: Nose normal.  Eyes:     General: No scleral icterus.       Right eye: No discharge.        Left eye: No discharge.     Conjunctiva/sclera: Conjunctivae normal.      Pupils: Pupils are equal, round, and reactive to light.  Cardiovascular:     Rate and Rhythm: Normal rate and regular rhythm.     Heart sounds: No murmur. No friction rub. No gallop.   Pulmonary:     Effort: Pulmonary effort is normal. No respiratory distress.     Breath sounds: Normal breath sounds. No stridor. No rales.  Abdominal:     General: There is no distension.     Palpations: Abdomen is soft.     Tenderness: There is no abdominal tenderness.  Musculoskeletal:        General: No tenderness.     Cervical back: Normal range of motion and neck supple.  Skin:    General: Skin is warm and dry.     Findings: No erythema or rash.  Neurological:     Mental Status: He is alert and oriented to person, place, and time.     ED Results and Treatments Labs (all labs ordered are listed, but only abnormal results are displayed) Labs Reviewed  CBC WITH DIFFERENTIAL/PLATELET - Abnormal; Notable for the following components:      Result Value   RBC 3.86 (*)    Hemoglobin 12.6 (*)    HCT 36.2 (*)    All other components within normal limits  BASIC METABOLIC PANEL - Abnormal; Notable for the following components:   Glucose, Bld 120 (*)    Creatinine, Ser 2.26 (*)    GFR calc non Af Amer 31 (*)    GFR calc Af Amer 36 (*)    All other components within normal limits  EKG  EKG Interpretation  Date/Time:  Monday April 08 2019 03:17:09 EST Ventricular Rate:  92 PR Interval:    QRS Duration: 77 QT Interval:  330 QTC Calculation: 409 R Axis:   86 Text Interpretation: Sinus rhythm No significant change since last tracing Confirmed by Drema Pry [phone]) on 04/08/2019 4:07:46 AM      Radiology No results found.  Pertinent labs & imaging results that were available during my care of the patient were reviewed by me and considered in my medical decision making  (see chart for details).  Medications Ordered in ED Medications  sodium chloride 0.9 % bolus 1,000 mL (0 mLs Intravenous Stopped 04/08/19 0543)  alum & mag hydroxide-simeth (MAALOX/MYLANTA) 200-200-20 MG/5ML suspension 30 mL (30 mLs Oral Given 04/08/19 0343)                                                                                                                                    Procedures Procedures  (including critical care time)  Medical Decision Making / ED Course I have reviewed the nursing notes for this encounter and the patient's prior records (if available in EHR or on provided paperwork).   Corey Mcguire was evaluated in Emergency Department on 04/08/2019 for the symptoms described in the history of present illness. He was evaluated in the context of the global COVID-19 pandemic, which necessitated consideration that the patient might be at risk for infection with the SARS-CoV-2 virus that causes COVID-19. Institutional protocols and algorithms that pertain to the evaluation of patients at risk for COVID-19 are in a state of rapid change based on information released by regulatory bodies including the CDC and federal and state organizations. These policies and algorithms were followed during the patient's care in the ED.  Postprandial emesis. Abdomen benign.  Abscess reassuring without leukocytosis or significant anemia.  No significant electrolyte derangements.  Patient does have mild renal insufficiency compared to most recent BMP.  Patient treated with GI cocktail which provided good relief. Given IVF bolus.  Low suspicion for serious intra-abdominal premature/infectious process or bowel obstruction requiring imaging at this time.  Patient has been able to tolerate oral intake.  EKG without acute ischemic changes, doubt cardiac etiology.  The patient appears reasonably screened and/or stabilized for discharge and I doubt any other medical condition or other [facility]  requiring further screening, evaluation, or treatment in the ED at this time prior to discharge.  The patient is safe for discharge with strict return precautions.       Final Clinical Impression(s) / ED Diagnoses Final diagnoses:  Gastroesophageal reflux disease, unspecified whether esophagitis present  Nausea and vomiting in adult  AKI (acute kidney injury) (HCC)    The patient appears reasonably screened and/or stabilized for discharge and I doubt any other medical condition or other [facility] requiring further screening, evaluation, or treatment in the ED at this time prior to discharge.  Disposition: Discharge  Condition: Good  I have discussed the results, Dx and Tx plan with the patient who expressed understanding and agree(s) with the plan. Discharge instructions discussed at great length. The patient was given strict return precautions who verbalized understanding of the instructions. No further questions at time of discharge.    ED Discharge Orders    None      Follow Up: Wilfrid Lund, PA [address] [place] [address] [phone]   in 5-7 days for repeat renal function test      This chart was dictated using voice recognition software.  Despite best efforts to proofread,  errors can occur which can change the documentation meaning.   Nira Conn, MD 04/08/19 [phone]

## 2019-06-11 ENCOUNTER — Other Ambulatory Visit: Payer: Self-pay | Admitting: Nephrology

## 2019-06-11 DIAGNOSIS — N1832 Chronic kidney disease, stage 3b: Secondary | ICD-10-CM

## 2019-06-12 ENCOUNTER — Ambulatory Visit
Admission: RE | Admit: 2019-06-12 | Discharge: 2019-06-12 | Disposition: A | Discharge status: Home / Self Care | Payer: BC Managed Care – PPO | Source: Ambulatory Visit | Attending: Nephrology | Admitting: Nephrology

## 2019-06-12 DIAGNOSIS — N1832 Chronic kidney disease, stage 3b: Secondary | ICD-10-CM

## 2019-06-17 ENCOUNTER — Other Ambulatory Visit (HOSPITAL_COMMUNITY): Payer: Self-pay | Admitting: Nephrology

## 2019-06-17 DIAGNOSIS — R809 Proteinuria, unspecified: Secondary | ICD-10-CM

## 2019-06-17 DIAGNOSIS — N059 Unspecified nephritic syndrome with unspecified morphologic changes: Secondary | ICD-10-CM

## 2019-06-17 DIAGNOSIS — N1832 Chronic kidney disease, stage 3b: Secondary | ICD-10-CM

## 2019-06-21 ENCOUNTER — Other Ambulatory Visit: Payer: Self-pay | Admitting: Student

## 2019-06-21 ENCOUNTER — Other Ambulatory Visit: Payer: Self-pay | Admitting: Radiology

## 2019-06-24 ENCOUNTER — Other Ambulatory Visit: Payer: Self-pay

## 2019-06-24 ENCOUNTER — Encounter (HOSPITAL_COMMUNITY): Payer: Self-pay

## 2019-06-24 ENCOUNTER — Ambulatory Visit (HOSPITAL_COMMUNITY)
Admission: RE | Admit: 2019-06-24 | Discharge: 2019-06-24 | Disposition: A | Discharge status: Home / Self Care | Payer: BC Managed Care – PPO | Source: Ambulatory Visit | Attending: Nephrology | Admitting: Nephrology

## 2019-06-24 DIAGNOSIS — N059 Unspecified nephritic syndrome with unspecified morphologic changes: Secondary | ICD-10-CM

## 2019-06-24 DIAGNOSIS — Z79899 Other long term (current) drug therapy: Secondary | ICD-10-CM | POA: Diagnosis not present

## 2019-06-24 DIAGNOSIS — Z7984 Long term (current) use of oral hypoglycemic drugs: Secondary | ICD-10-CM | POA: Insufficient documentation

## 2019-06-24 DIAGNOSIS — I129 Hypertensive chronic kidney disease with stage 1 through stage 4 chronic kidney disease, or unspecified chronic kidney disease: Secondary | ICD-10-CM | POA: Insufficient documentation

## 2019-06-24 DIAGNOSIS — N183 Chronic kidney disease, stage 3 unspecified: Secondary | ICD-10-CM | POA: Insufficient documentation

## 2019-06-24 DIAGNOSIS — F419 Anxiety disorder, unspecified: Secondary | ICD-10-CM | POA: Insufficient documentation

## 2019-06-24 DIAGNOSIS — I7090 Unspecified atherosclerosis: Secondary | ICD-10-CM | POA: Insufficient documentation

## 2019-06-24 DIAGNOSIS — R809 Proteinuria, unspecified: Secondary | ICD-10-CM

## 2019-06-24 DIAGNOSIS — N189 Chronic kidney disease, unspecified: Secondary | ICD-10-CM | POA: Diagnosis present

## 2019-06-24 DIAGNOSIS — N1832 Chronic kidney disease, stage 3b: Secondary | ICD-10-CM

## 2019-06-24 LAB — PROTIME-INR
INR: 1 (ref 0.8–1.2)
Prothrombin Time: 13.3 seconds (ref 11.4–15.2)

## 2019-06-24 LAB — CBC
HCT: 30.1 % — ABNORMAL LOW (ref 39.0–52.0)
Hemoglobin: 10.6 g/dL — ABNORMAL LOW (ref 13.0–17.0)
MCH: 33.8 pg (ref 26.0–34.0)
MCHC: 35.2 g/dL (ref 30.0–36.0)
MCV: 95.9 fL (ref 80.0–100.0)
Platelets: 245 10*3/uL (ref 150–400)
RBC: 3.14 MIL/uL — ABNORMAL LOW (ref 4.22–5.81)
RDW: 13.7 % (ref 11.5–15.5)
WBC: 6.5 10*3/uL (ref 4.0–10.5)
nRBC: 0.5 % — ABNORMAL HIGH (ref 0.0–0.2)

## 2019-06-24 MED ORDER — LIDOCAINE HCL (PF) 1 % IJ SOLN
INTRAMUSCULAR | Status: AC
  Filled 2019-06-24: qty 30

## 2019-06-24 MED ORDER — SODIUM CHLORIDE 0.9 % IV SOLN: INTRAVENOUS | Status: DC

## 2019-06-24 MED ORDER — FENTANYL CITRATE (PF) 100 MCG/2ML IJ SOLN
INTRAMUSCULAR | Status: AC | PRN
  Administered 2019-06-24: 50 ug via INTRAVENOUS

## 2019-06-24 MED ORDER — FENTANYL CITRATE (PF) 100 MCG/2ML IJ SOLN
INTRAMUSCULAR | Status: AC
  Filled 2019-06-24: qty 2

## 2019-06-24 MED ORDER — MIDAZOLAM HCL 2 MG/2ML IJ SOLN
INTRAMUSCULAR | Status: AC | PRN
  Administered 2019-06-24 (×2): 1 mg via INTRAVENOUS

## 2019-06-24 MED ORDER — GELATIN ABSORBABLE 12-7 MM EX MISC
CUTANEOUS | Status: AC
  Filled 2019-06-24: qty 1

## 2019-06-24 MED ORDER — MIDAZOLAM HCL 2 MG/2ML IJ SOLN
INTRAMUSCULAR | Status: AC
  Filled 2019-06-24: qty 2

## 2019-06-24 NOTE — H&P (Signed)
Chief Complaint: Patient was seen in consultation today for random renal biopsy at the request of CoreyJoseph  Referring Physician(s): CoreyJoseph  Supervising Physician: Corey Mcguire  Patient Status: [facility] - Out[facility]  History of Present Illness: Corey Mcguire is a 59 y.o. male   Pt was made aware of renal function decline found incidentally when he came to ED 04/08/19 for "chest pain" All ruled wnl except elevated Cr  Referred to Dr Corey Mcguire Renal: IMPRESSION: 1. Mild cortical thinning and/or focal cortical scarring at the lower poles of both kidneys. 2. Otherwise unremarkable and normal renal ultrasound. No Hydronephrosis.  CKD3; proteinuria; glomerulonephritis Now scheduled for random renal biopsy per Nephrology    Past Medical History:  Diagnosis Date  . Anxiety    History of panic attacks 2016--improved with employment  . Hyperlipidemia 10/02/2014   Labs in [facility]:  Total:  330, Trigs:  109, HDL:  89,  LDL:  219.  did not follow up afterward  . Hypertension   . Libido, decreased 12/26/2018  . Prediabetes 10/02/2014   A1C:  5.8%  did not follow up    Past Surgical History:  Procedure Laterality Date  . KNEE SURGERY    . SHOULDER SURGERY      Allergies: Codeine  Medications: Prior to Admission medications   Medication Sig Start Date End Date Taking? Authorizing Provider  amLODipine (NORVASC) 10 MG tablet TAKE ONE TABLET BY MOUTH DAILY Patient taking differently: Take 10 mg by mouth daily.  12/16/16  Yes Julieanne Manson, MD  Cholecalciferol (VITAMIN D3) 50 MCG (2000 UT) capsule Take 4,000 Units by mouth daily.   Yes [provider]  Cyanocobalamin (VITAMIN B-12) 3000 MCG SUBL Place 3,000 mg under the tongue daily.   Yes [provider]  metFORMIN (GLUCOPHAGE-XR) 500 MG 24 hr tablet Take 500 mg by mouth daily with breakfast.  09/05/18  Yes [provider]  omeprazole (PRILOSEC) 40 MG capsule Take 40 mg  by mouth daily.  10/24/18  Yes [provider]  potassium chloride (MICRO-K) 10 MEQ CR capsule Take 1 capsule (10 mEq total) by mouth 2 (two) times daily. 11/14/18  Yes Shamleffer, Konrad Dolores, MD  rosuvastatin (CRESTOR) 20 MG tablet Take 20 mg by mouth daily.  08/18/18  Yes [provider]  sildenafil (REVATIO) 20 MG tablet Take 20 mg by mouth as needed (ED).   Yes [provider]  valsartan-hydrochlorothiazide (DIOVAN-HCT) 320-25 MG tablet Take 1 tablet by mouth daily.  08/01/18  Yes [provider]  acetaminophen (TYLENOL) 500 MG tablet Take 1,000 mg by mouth every 6 (six) hours as needed for mild pain.    [provider]     Family History  Problem Relation Age of Onset  . Hypertension Mother   . Diabetes Father   . Heart disease Father        Quadruple bypass    Social History   Socioeconomic History  . Marital status: Married    Spouse name: Not on file  . Number of children: Not on file  . Years of education: college grad  . Highest education level: Not on file  Occupational History  . Occupation: Magazine features editor: Advice worker [employer]    Comment: Midwife at [employer]  . Smoking status: Never Smoker  . Smokeless tobacco: Current User    Types: Chew  . Tobacco comment: Chewed tobacco since age 29 yo  Substance and Sexual Activity  .  Alcohol use: Yes    Alcohol/week: 10.0 - 14.0 standard drinks    Types: 10 - 14 Cans of beer per week  . Drug use: No  . Sexual activity: Not on file  Other Topics Concern  . Not on file  Social History Narrative   Lives at home with wife   Social Determinants of Health   Financial Resource Strain:   . Difficulty of Paying Living Expenses:   Food Insecurity:   . Worried About Programme researcher, broadcasting/film/video in the Last Year:   . Barista in the Last Year:   Transportation Needs:   . Freight forwarder (Medical):   Marland Kitchen Lack of Transportation  (Non-Medical):   Physical Activity:   . Days of Exercise per Week:   . Minutes of Exercise per Session:   Stress:   . Feeling of Stress :   Social Connections:   . Frequency of Communication with Friends and Family:   . Frequency of Social Gatherings with Friends and Family:   . Attends Religious Services:   . Active Member of Clubs or Organizations:   . Attends Banker Meetings:   Marland Kitchen Marital Status:     Review of Systems: A 12 point ROS discussed and pertinent positives are indicated in the HPI above.  All other systems are negative.  Review of Systems  Constitutional: Negative for activity change, appetite change, fatigue, fever and unexpected weight change.  HENT: Negative for facial swelling.   Respiratory: Negative for cough and shortness of breath.   Cardiovascular: Negative for chest pain.  Gastrointestinal: Negative for abdominal pain.  Musculoskeletal: Negative for back pain.  Neurological: Negative for weakness.  Psychiatric/Behavioral: Negative for behavioral problems and confusion.    Vital Signs: BP (!) 124/95   Pulse 85   Temp 98.3 F (36.8 C) (Skin)   Resp 18   Ht 5\' 7"  (1.702 m)   Wt 212 lb (96.2 kg)   SpO2 100%   BMI 33.20 kg/m   Physical Exam Vitals reviewed.  Cardiovascular:     Rate and Rhythm: Normal rate and regular rhythm.     Heart sounds: Normal heart sounds.  Pulmonary:     Effort: Pulmonary effort is normal.     Breath sounds: Normal breath sounds.  Abdominal:     Palpations: Abdomen is soft.     Tenderness: There is no abdominal tenderness.  Musculoskeletal:        General: Normal range of motion.  Skin:    General: Skin is warm and dry.  Neurological:     Mental Status: He is alert and oriented to person, place, and time.  Psychiatric:        Behavior: Behavior normal.        Thought Content: Thought content normal.        Judgment: Judgment normal.     Imaging: RENAL  Result Date: 06/12/2019 CLINICAL DATA:   Initial evaluation for stage III B chronic kidney disease. EXAM: RENAL / URINARY TRACT ULTRASOUND COMPLETE COMPARISON:  None available. FINDINGS: Right Kidney: Renal measurements: 10.9 x 5.3 x 5.8 cm = volume: 174.2 mL. Echogenicity within normal limits. Mild cortical thinning at the lower pole. No nephrolithiasis or hydronephrosis. No focal renal mass. Left Kidney: Renal measurements: 12.1 x 5.8 x 5.0 cm = volume: 182.8 mL. Echogenicity within normal limits. Focal cortical scarring noted at the lower pole. No nephrolithiasis or hydronephrosis. No focal renal mass. Bladder: Appears normal for degree of  bladder distention. Other: None. IMPRESSION: 1. Mild cortical thinning and/or focal cortical scarring at the lower poles of both kidneys. 2. Otherwise unremarkable and normal renal ultrasound. No hydronephrosis. Electronically Signed   By: Jeannine Boga M.D.   On: 06/12/2019 19:52    Labs:  CBC: Recent Labs    11/14/18 0804 04/08/19 0320  WBC 8.6 9.9  HGB 11.6* 12.6*  HCT 32.6* 36.2*  PLT 321.0 299    COAGS: No results for input(s): INR, APTT in the last 8760 hours.  BMP: Recent Labs    11/14/18 0804 04/08/19 0320  NA 139 137  K 2.6* 4.0  CL 88* 101  CO2 38* 23  GLUCOSE 165* 120*  BUN 10 14  CALCIUM 9.2 9.5  CREATININE 2.07* 2.26*  GFRNONAA  --  31*  GFRAA  --  36*    LIVER FUNCTION TESTS: Recent Labs    11/14/18 0804  BILITOT 1.1  AST 36  ALT 24  ALKPHOS 53  PROT 7.3  ALBUMIN 4.2    TUMOR MARKERS: No results for input(s): AFPTM, CEA, CA199, CHROMGRNA in the last 8760 hours.  Assessment and Plan:  CKD3 Proteinuria Glomerulonephritis Scheduled for Random renal biopsy Risks and benefits of random renal bx was discussed with the patient and/or patient's family including, but not limited to bleeding, infection, damage to adjacent structures or low yield requiring additional tests.  All of the questions were answered and there is agreement to proceed.  Consent signed and in chart.   Thank you for this interesting consult.  I greatly enjoyed meeting TRINTON PREWITT and look forward to participating in their care.  A copy of this report was sent to the requesting provider on this date.  Electronically Signed: Lavonia Drafts, PA-C 06/24/2019, 7:30 AM   I spent a total of  30 Minutes   in face to face in clinical consultation, greater than 50% of which was counseling/coordinating care for random renal bx

## 2019-06-24 NOTE — Discharge Instructions (Addendum)
Percutaneous Kidney Biopsy, Care After This sheet gives you information about how to care for yourself after your procedure. Your health care provider may also give you more specific instructions. If you have problems or questions, contact your health care provider. What can I expect after the procedure? After the procedure, it is common to have:  Pain or soreness near the biopsy site.  Pink or cloudy urine for 24 hours after the procedure. Follow these instructions at home: Activity  Return to your normal activities as told by your health care provider. Ask your health care provider what activities are safe for you.  If you were given a sedative during the procedure, it can affect you for several hours. Do not drive or operate machinery until your health care provider says that it is safe.  Do not lift anything that is heavier than 10 lb (4.5 kg), or the limit that you are told, until your health care provider says that it is safe.  Avoid activities that take a lot of effort (are strenuous) until your health care provider approves. Most people will have to wait 2 weeks before returning to activities such as exercise or sex. General instructions   Take over-the-counter and prescription medicines only as told by your health care provider.  You may eat and drink after your procedure. Follow instructions from your health care provider about eating or drinking restrictions.  Check your biopsy site every day for signs of infection. Check for: ? More redness, swelling, or pain. ? Fluid or blood. ? Warmth. ? Pus or a bad smell.  Keep all follow-up visits as told by your health care provider. This is important. Contact a health care provider if:  You have more redness, swelling, or pain around your biopsy site.  You have fluid or blood coming from your biopsy site.  Your biopsy site feels warm to the touch.  You have pus or a bad smell coming from your biopsy site.  You have blood  in your urine more than 24 hours after your procedure.  You have a fever. Get help right away if:  Your urine is dark red or brown.  You cannot urinate.  It burns when you urinate.  You feel dizzy or light-headed.  You have severe pain in your abdomen or side. Summary  After the procedure, it is common to have pain or soreness at the biopsy site and pink or cloudy urine for the first 24 hours.  Check your biopsy site each day for signs of infection, such as more redness, swelling, or pain; fluid, blood, pus or a bad smell coming from the biopsy site; or the biopsy site feeling warm to touch.  Return to your normal activities as told by your health care provider. This information is not intended to replace advice given to you by your health care provider. Make sure you discuss any questions you have with your health care provider. Document Revised: 11/08/2018 Document Reviewed: 11/08/2018 Elsevier Patient Education  2020 Elsevier Inc. Moderate Conscious Sedation, Adult Sedation is the use of medicines to promote relaxation and relieve discomfort and anxiety. Moderate conscious sedation is a type of sedation. Under moderate conscious sedation, you are less alert than normal, but you are still able to respond to instructions, touch, or both. Moderate conscious sedation is used during short medical and dental procedures. It is milder than deep sedation, which is a type of sedation under which you cannot be easily woken up. It is also milder than   general anesthesia, which is the use of medicines to make you unconscious. Moderate conscious sedation allows you to return to your regular activities sooner. Tell a health care provider about:  Any allergies you have.  All medicines you are taking, including vitamins, herbs, eye drops, creams, and over-the-counter medicines.  Use of steroids (by mouth or creams).  Any problems you or family members have had with sedatives and anesthetic  medicines.  Any blood disorders you have.  Any surgeries you have had.  Any medical conditions you have, such as sleep apnea.  Whether you are pregnant or may be pregnant.  Any use of cigarettes, alcohol, marijuana, or street drugs. What are the risks? Generally, this is a safe procedure. However, problems may occur, including:  Getting too much medicine (oversedation).  Nausea.  Allergic reaction to medicines.  Trouble breathing. If this happens, a breathing tube may be used to help with breathing. It will be removed when you are awake and breathing on your own.  Heart trouble.  Lung trouble. What happens before the procedure? Staying hydrated Follow instructions from your health care provider about hydration, which may include:  Up to 2 hours before the procedure - you may continue to drink clear liquids, such as water, clear fruit juice, black coffee, and plain tea. Eating and drinking restrictions Follow instructions from your health care provider about eating and drinking, which may include:  8 hours before the procedure - stop eating heavy meals or foods such as meat, fried foods, or fatty foods.  6 hours before the procedure - stop eating light meals or foods, such as toast or cereal.  6 hours before the procedure - stop drinking milk or drinks that contain milk.  2 hours before the procedure - stop drinking clear liquids. Medicine Ask your health care provider about:  Changing or stopping your regular medicines. This is especially important if you are taking diabetes medicines or blood thinners.  Taking medicines such as aspirin and ibuprofen. These medicines can thin your blood. Do not take these medicines before your procedure if your health care provider instructs you not to.  Tests and exams  You will have a physical exam.  You may have blood tests done to show: ? How well your kidneys and liver are working. ? How well your blood can clot. General  instructions  Plan to have someone take you home from the hospital or clinic.  If you will be going home right after the procedure, plan to have someone with you for 24 hours. What happens during the procedure?  An IV tube will be inserted into one of your veins.  Medicine to help you relax (sedative) will be given through the IV tube.  The medical or dental procedure will be performed. What happens after the procedure?  Your blood pressure, heart rate, breathing rate, and blood oxygen level will be monitored often until the medicines you were given have worn off.  Do not drive for 24 hours. This information is not intended to replace advice given to you by your health care provider. Make sure you discuss any questions you have with your health care provider. Document Revised: 02/17/2017 Document Reviewed: 06/27/2015 Elsevier Patient Education  2020 Elsevier Inc.  

## 2019-06-24 NOTE — Procedures (Signed)
Interventional Radiology Procedure Note  Procedure:  US guided left medical renal biopsy.  Complications: None Recommendations:  - Ok to shower tomorrow - Do not submerge for 7 days - Routine care - advance diet - 2 hr dc home when goals met   Signed,  Dulcy Fanny. Earleen Newport, DO

## 2019-07-05 LAB — SURGICAL PATHOLOGY

## 2019-07-17 ENCOUNTER — Encounter (HOSPITAL_COMMUNITY): Payer: Self-pay | Admitting: Nephrology

## 2019-08-12 ENCOUNTER — Other Ambulatory Visit: Payer: Self-pay | Admitting: Family Medicine

## 2019-08-12 DIAGNOSIS — R7989 Other specified abnormal findings of blood chemistry: Secondary | ICD-10-CM

## 2019-08-23 ENCOUNTER — Ambulatory Visit
Admission: RE | Admit: 2019-08-23 | Discharge: 2019-08-23 | Disposition: A | Discharge status: Home / Self Care | Payer: BC Managed Care – PPO | Source: Ambulatory Visit | Attending: Family Medicine | Admitting: Family Medicine

## 2019-08-23 DIAGNOSIS — R7989 Other specified abnormal findings of blood chemistry: Secondary | ICD-10-CM

## 2020-02-25 ENCOUNTER — Encounter (HOSPITAL_COMMUNITY): Payer: Self-pay

## 2020-02-25 ENCOUNTER — Ambulatory Visit (HOSPITAL_COMMUNITY)
Admission: EM | Admit: 2020-02-25 | Discharge: 2020-02-25 | Disposition: A | Discharge status: Home / Self Care | Payer: BC Managed Care – PPO | Source: Home / Self Care

## 2020-02-25 ENCOUNTER — Emergency Department (HOSPITAL_COMMUNITY): Payer: BC Managed Care – PPO

## 2020-02-25 ENCOUNTER — Other Ambulatory Visit: Payer: Self-pay

## 2020-02-25 ENCOUNTER — Inpatient Hospital Stay (HOSPITAL_COMMUNITY)
Admission: EM | Admit: 2020-02-25 | Discharge: 2020-02-27 | DRG: 683 | Disposition: A | Discharge status: Home / Self Care | Payer: BC Managed Care – PPO | Source: Ambulatory Visit | Attending: Internal Medicine | Admitting: Internal Medicine

## 2020-02-25 DIAGNOSIS — R197 Diarrhea, unspecified: Secondary | ICD-10-CM | POA: Diagnosis present

## 2020-02-25 DIAGNOSIS — Z720 Tobacco use: Secondary | ICD-10-CM

## 2020-02-25 DIAGNOSIS — Z885 Allergy status to narcotic agent status: Secondary | ICD-10-CM

## 2020-02-25 DIAGNOSIS — I700 Atherosclerosis of aorta: Secondary | ICD-10-CM | POA: Diagnosis present

## 2020-02-25 DIAGNOSIS — Z20822 Contact with and (suspected) exposure to covid-19: Secondary | ICD-10-CM | POA: Insufficient documentation

## 2020-02-25 DIAGNOSIS — F419 Anxiety disorder, unspecified: Secondary | ICD-10-CM | POA: Diagnosis present

## 2020-02-25 DIAGNOSIS — Z833 Family history of diabetes mellitus: Secondary | ICD-10-CM

## 2020-02-25 DIAGNOSIS — N179 Acute kidney failure, unspecified: Principal | ICD-10-CM | POA: Diagnosis present

## 2020-02-25 DIAGNOSIS — Z7984 Long term (current) use of oral hypoglycemic drugs: Secondary | ICD-10-CM

## 2020-02-25 DIAGNOSIS — K529 Noninfective gastroenteritis and colitis, unspecified: Secondary | ICD-10-CM | POA: Diagnosis not present

## 2020-02-25 DIAGNOSIS — R748 Abnormal levels of other serum enzymes: Secondary | ICD-10-CM | POA: Diagnosis present

## 2020-02-25 DIAGNOSIS — E871 Hypo-osmolality and hyponatremia: Secondary | ICD-10-CM | POA: Diagnosis present

## 2020-02-25 DIAGNOSIS — E785 Hyperlipidemia, unspecified: Secondary | ICD-10-CM | POA: Diagnosis present

## 2020-02-25 DIAGNOSIS — D631 Anemia in chronic kidney disease: Secondary | ICD-10-CM | POA: Diagnosis present

## 2020-02-25 DIAGNOSIS — I10 Essential (primary) hypertension: Secondary | ICD-10-CM | POA: Diagnosis not present

## 2020-02-25 DIAGNOSIS — N1832 Chronic kidney disease, stage 3b: Secondary | ICD-10-CM | POA: Diagnosis present

## 2020-02-25 DIAGNOSIS — R112 Nausea with vomiting, unspecified: Secondary | ICD-10-CM | POA: Diagnosis present

## 2020-02-25 DIAGNOSIS — R7303 Prediabetes: Secondary | ICD-10-CM | POA: Diagnosis present

## 2020-02-25 DIAGNOSIS — E860 Dehydration: Secondary | ICD-10-CM | POA: Diagnosis present

## 2020-02-25 DIAGNOSIS — I129 Hypertensive chronic kidney disease with stage 1 through stage 4 chronic kidney disease, or unspecified chronic kidney disease: Secondary | ICD-10-CM | POA: Diagnosis present

## 2020-02-25 DIAGNOSIS — Z841 Family history of disorders of kidney and ureter: Secondary | ICD-10-CM | POA: Diagnosis not present

## 2020-02-25 DIAGNOSIS — N170 Acute kidney failure with tubular necrosis: Secondary | ICD-10-CM | POA: Diagnosis not present

## 2020-02-25 DIAGNOSIS — Z8249 Family history of ischemic heart disease and other diseases of the circulatory system: Secondary | ICD-10-CM

## 2020-02-25 DIAGNOSIS — F410 Panic disorder [episodic paroxysmal anxiety] without agoraphobia: Secondary | ICD-10-CM | POA: Diagnosis present

## 2020-02-25 DIAGNOSIS — Z79899 Other long term (current) drug therapy: Secondary | ICD-10-CM | POA: Diagnosis not present

## 2020-02-25 LAB — URINALYSIS, ROUTINE W REFLEX MICROSCOPIC
Bilirubin Urine: NEGATIVE
Glucose, UA: NEGATIVE mg/dL
Ketones, ur: NEGATIVE mg/dL
Nitrite: NEGATIVE
Protein, ur: 30 mg/dL — AB
Specific Gravity, Urine: 1.006 (ref 1.005–1.030)
pH: 5 (ref 5.0–8.0)

## 2020-02-25 LAB — I-STAT CHEM 8, ED
BUN: 57 mg/dL — ABNORMAL HIGH (ref 6–20)
Calcium, Ion: 1.01 mmol/L — ABNORMAL LOW (ref 1.15–1.40)
Chloride: 98 mmol/L (ref 98–111)
Creatinine, Ser: 15.7 mg/dL — ABNORMAL HIGH (ref 0.61–1.24)
Glucose, Bld: 90 mg/dL (ref 70–99)
HCT: 39 % (ref 39.0–52.0)
Hemoglobin: 13.3 g/dL (ref 13.0–17.0)
Potassium: 4.2 mmol/L (ref 3.5–5.1)
Sodium: 132 mmol/L — ABNORMAL LOW (ref 135–145)
TCO2: 21 mmol/L — ABNORMAL LOW (ref 22–32)

## 2020-02-25 LAB — CBC WITH DIFFERENTIAL/PLATELET
Abs Immature Granulocytes: 0.03 10*3/uL (ref 0.00–0.07)
Abs Immature Granulocytes: 0.02 10*3/uL (ref 0.00–0.07)
Basophils Absolute: 0 10*3/uL (ref 0.0–0.1)
Basophils Absolute: 0 10*3/uL (ref 0.0–0.1)
Basophils Relative: 0 %
Basophils Relative: 0 %
Eosinophils Absolute: 0 10*3/uL (ref 0.0–0.5)
Eosinophils Absolute: 0 10*3/uL (ref 0.0–0.5)
Eosinophils Relative: 0 %
Eosinophils Relative: 0 %
HCT: 36.4 % — ABNORMAL LOW (ref 39.0–52.0)
HCT: 32.5 % — ABNORMAL LOW (ref 39.0–52.0)
Hemoglobin: 13.8 g/dL (ref 13.0–17.0)
Hemoglobin: 11.8 g/dL — ABNORMAL LOW (ref 13.0–17.0)
Immature Granulocytes: 0 %
Immature Granulocytes: 0 %
Lymphocytes Relative: 31 %
Lymphocytes Relative: 29 %
Lymphs Abs: 2.3 10*3/uL (ref 0.7–4.0)
Lymphs Abs: 1.6 10*3/uL (ref 0.7–4.0)
MCH: 34.9 pg — ABNORMAL HIGH (ref 26.0–34.0)
MCH: 33.9 pg (ref 26.0–34.0)
MCHC: 36.7 g/dL — ABNORMAL HIGH (ref 30.0–36.0)
MCHC: 36.3 g/dL — ABNORMAL HIGH (ref 30.0–36.0)
MCV: 92.2 fL (ref 80.0–100.0)
MCV: 93.4 fL (ref 80.0–100.0)
Monocytes Absolute: 1.3 10*3/uL — ABNORMAL HIGH (ref 0.1–1.0)
Monocytes Absolute: 1.2 10*3/uL — ABNORMAL HIGH (ref 0.1–1.0)
Monocytes Relative: 18 %
Monocytes Relative: 21 %
Neutro Abs: 3.7 10*3/uL (ref 1.7–7.7)
Neutro Abs: 2.8 10*3/uL (ref 1.7–7.7)
Neutrophils Relative %: 51 %
Neutrophils Relative %: 50 %
Platelets: 248 10*3/uL (ref 150–400)
Platelets: 217 10*3/uL (ref 150–400)
RBC: 3.95 MIL/uL — ABNORMAL LOW (ref 4.22–5.81)
RBC: 3.48 MIL/uL — ABNORMAL LOW (ref 4.22–5.81)
RDW: 12.3 % (ref 11.5–15.5)
RDW: 12.4 % (ref 11.5–15.5)
WBC: 7.4 10*3/uL (ref 4.0–10.5)
WBC: 5.6 10*3/uL (ref 4.0–10.5)
nRBC: 0 % (ref 0.0–0.2)
nRBC: 0 % (ref 0.0–0.2)

## 2020-02-25 LAB — BASIC METABOLIC PANEL
Anion gap: 16 — ABNORMAL HIGH (ref 5–15)
BUN: 42 mg/dL — ABNORMAL HIGH (ref 6–20)
CO2: 19 mmol/L — ABNORMAL LOW (ref 22–32)
Calcium: 8.1 mg/dL — ABNORMAL LOW (ref 8.9–10.3)
Chloride: 95 mmol/L — ABNORMAL LOW (ref 98–111)
Creatinine, Ser: 15.89 mg/dL — ABNORMAL HIGH (ref 0.61–1.24)
GFR, Estimated: 3 mL/min — ABNORMAL LOW (ref >60)
Glucose, Bld: 98 mg/dL (ref 70–99)
Potassium: 4.5 mmol/L (ref 3.5–5.1)
Sodium: 130 mmol/L — ABNORMAL LOW (ref 135–145)

## 2020-02-25 LAB — COMPREHENSIVE METABOLIC PANEL
ALT: 44 U/L (ref 0–44)
AST: 15 U/L (ref 15–41)
Albumin: 4.2 g/dL (ref 3.5–5.0)
Alkaline Phosphatase: 132 U/L — ABNORMAL HIGH (ref 38–126)
Anion gap: 17 — ABNORMAL HIGH (ref 5–15)
BUN: 43 mg/dL — ABNORMAL HIGH (ref 6–20)
CO2: 19 mmol/L — ABNORMAL LOW (ref 22–32)
Calcium: 8.1 mg/dL — ABNORMAL LOW (ref 8.9–10.3)
Chloride: 93 mmol/L — ABNORMAL LOW (ref 98–111)
Creatinine, Ser: 16.73 mg/dL — ABNORMAL HIGH (ref 0.61–1.24)
GFR, Estimated: 3 mL/min — ABNORMAL LOW (ref >60)
Glucose, Bld: 96 mg/dL (ref 70–99)
Potassium: 3.5 mmol/L (ref 3.5–5.1)
Sodium: 129 mmol/L — ABNORMAL LOW (ref 135–145)
Total Bilirubin: 1.4 mg/dL — ABNORMAL HIGH (ref 0.3–1.2)
Total Protein: 8.2 g/dL — ABNORMAL HIGH (ref 6.5–8.1)

## 2020-02-25 LAB — SARS CORONAVIRUS 2 (TAT 6-24 HRS): SARS Coronavirus 2: NEGATIVE

## 2020-02-25 LAB — CK: Total CK: 326 U/L (ref 49–397)

## 2020-02-25 LAB — LIPASE, BLOOD: Lipase: 367 U/L — ABNORMAL HIGH (ref 11–51)

## 2020-02-25 LAB — RESP PANEL BY RT-PCR (FLU A&B, COVID) ARPGX2
Influenza A by PCR: NEGATIVE
Influenza B by PCR: NEGATIVE
SARS Coronavirus 2 by RT PCR: NEGATIVE

## 2020-02-25 MED ORDER — HYDRALAZINE HCL 20 MG/ML IJ SOLN: 10.0000 mg | INTRAMUSCULAR | Status: DC | PRN

## 2020-02-25 MED ORDER — AMLODIPINE BESYLATE 10 MG PO TABS
10.0000 mg | ORAL_TABLET | Freq: Every day | ORAL | Status: DC
  Administered 2020-02-26 – 2020-02-27 (×2): 10 mg via ORAL
  Filled 2020-02-25: qty 2
  Filled 2020-02-25: qty 1

## 2020-02-25 MED ORDER — ONDANSETRON 8 MG PO TBDP: 8.0000 mg | ORAL_TABLET | Freq: Three times a day (TID) | ORAL | 0 refills | Status: DC | PRN

## 2020-02-25 MED ORDER — ONDANSETRON HCL 4 MG/2ML IJ SOLN: 4.0000 mg | Freq: Once | INTRAMUSCULAR | Status: DC

## 2020-02-25 MED ORDER — HEPARIN SODIUM (PORCINE) 5000 UNIT/ML IJ SOLN
5000.0000 [IU] | Freq: Three times a day (TID) | INTRAMUSCULAR | Status: DC
  Administered 2020-02-26 – 2020-02-27 (×3): 5000 [IU] via SUBCUTANEOUS
  Filled 2020-02-25 (×3): qty 1

## 2020-02-25 MED ORDER — SODIUM CHLORIDE 0.9 % IV BOLUS
1000.0000 mL | Freq: Once | INTRAVENOUS | Status: AC
  Administered 2020-02-25: 1000 mL via INTRAVENOUS

## 2020-02-25 MED ORDER — SODIUM CHLORIDE 0.9 % IV SOLN: INTRAVENOUS | Status: DC

## 2020-02-25 MED ORDER — ROSUVASTATIN CALCIUM 20 MG PO TABS
20.0000 mg | ORAL_TABLET | Freq: Every day | ORAL | Status: DC
  Administered 2020-02-26 – 2020-02-27 (×2): 20 mg via ORAL
  Filled 2020-02-25 (×2): qty 1

## 2020-02-25 NOTE — ED Notes (Signed)
Received a call from [place] in lab.  Reported corrections had been made to CBC ordered by Evern Core, PA.  Reported changes to provider

## 2020-02-25 NOTE — Discharge Instructions (Signed)
Drink clear liquids Advance diet as tolerated Follow up with PCP

## 2020-02-25 NOTE — ED Provider Notes (Signed)
[facility]    CSN: [id number] Arrival date & time: 02/25/20  6222      History   Chief Complaint Chief Complaint  Patient presents with  . Vomiting  . Diarrhea  . URI    HPI Corey Mcguire is a 59 y.o. male.   Patient here c/w vomiting and diarrhea x 1 week.  PMH HTN, pre DM, taking 10 meq klor-con for hypokalemia. Denies f/c, wheezing, coughing, SOB, constipation, hematochezia, melena.  Vomiting x 1 last 24 hours, diarrhea x 2 last 24 hours.  He is able to keep liquids down, though is experiencing some dizziness.  He has not been hungry and finds the smell of food nauseating.  No sick contacts.  He has had COVID vaccine.     Past Medical History:  Diagnosis Date  . Anxiety    History of panic attacks 2016--improved with employment  . Hyperlipidemia 10/02/2014   Labs in [facility]:  Total:  330, Trigs:  109, HDL:  89,  LDL:  219.  did not follow up afterward  . Hypertension   . Libido, decreased 12/26/2018  . Prediabetes 10/02/2014   A1C:  5.8%  did not follow up    Patient Active Problem List   Diagnosis Date Noted  . Libido, decreased 12/26/2018  . Vitamin D deficiency 11/12/2018  . Fatigue 11/12/2018  . Low testosterone in male 11/12/2018  . Essential hypertension 01/05/2016  . Prediabetes 10/02/2014  . Hyperlipidemia 10/02/2014    Past Surgical History:  Procedure Laterality Date  . KNEE SURGERY    . SHOULDER SURGERY         Home Medications    Prior to Admission medications   Medication Sig Start Date End Date Taking? Authorizing Provider  acetaminophen (TYLENOL) 500 MG tablet Take 1,000 mg by mouth every 6 (six) hours as needed for mild pain.    [provider]  amLODipine (NORVASC) 10 MG tablet TAKE ONE TABLET BY MOUTH DAILY Patient taking differently: Take 10 mg by mouth daily.  12/16/16   Julieanne Manson, MD  Cholecalciferol (VITAMIN D3) 50 MCG (2000 UT) capsule Take 4,000 Units by mouth daily.    [provider]  Cyanocobalamin (VITAMIN B-12) 3000 MCG SUBL Place 3,000 mg under the tongue daily.    [provider]  metFORMIN (GLUCOPHAGE-XR) 500 MG 24 hr tablet Take 500 mg by mouth daily with breakfast.  09/05/18   [provider]  omeprazole (PRILOSEC) 40 MG capsule Take 40 mg by mouth daily.  10/24/18   [provider]  ondansetron (ZOFRAN-ODT) 8 MG disintegrating tablet Take 1 tablet (8 mg total) by mouth every 8 (eight) hours as needed for nausea or vomiting. 02/25/20   Evern Core, PA-C  potassium chloride (MICRO-K) 10 MEQ CR capsule Take 1 capsule (10 mEq total) by mouth 2 (two) times daily. 11/14/18   Shamleffer, Konrad Dolores, MD  rosuvastatin (CRESTOR) 20 MG tablet Take 20 mg by mouth daily.  08/18/18   [provider]  sildenafil (REVATIO) 20 MG tablet Take 20 mg by mouth as needed (ED).    [provider]  valsartan-hydrochlorothiazide (DIOVAN-HCT) 320-25 MG tablet Take 1 tablet by mouth daily.  08/01/18   [provider]    Family History Family History  Problem Relation Age of Onset  . Hypertension Mother   . Diabetes Father   . Heart disease Father        Quadruple bypass    Social History Social History  Tobacco Use  . Smoking status: Never Smoker  . Smokeless tobacco: Current User    Types: Chew  . Tobacco comment: Chewed tobacco since age 6 yo  Substance Use Topics  . Alcohol use: Yes    Alcohol/week: 10.0 - 14.0 standard drinks    Types: 10 - 14 Cans of beer per week  . Drug use: No     Allergies   Codeine   Review of Systems Review of Systems  Constitutional: Negative for chills, fatigue and fever.  HENT: Negative for congestion, ear pain, nosebleeds, postnasal drip, rhinorrhea, sinus pressure, sinus pain and sore throat.   Eyes: Negative for pain and redness.  Respiratory: Negative for cough, shortness of breath and wheezing.   Gastrointestinal: Positive for diarrhea, nausea and vomiting.  Negative for abdominal pain.  Musculoskeletal: Negative for arthralgias and myalgias.  Skin: Negative for rash.  Neurological: Positive for light-headedness. Negative for dizziness, weakness, numbness and headaches.  Hematological: Negative for adenopathy. Does not bruise/bleed easily.  Psychiatric/Behavioral: Negative for confusion and sleep disturbance.     Physical Exam Triage Vital Signs ED Triage Vitals  Enc Vitals Group     BP 02/25/20 1052 109/74     Pulse Rate 02/25/20 1052 69     Resp 02/25/20 1052 17     Temp 02/25/20 1052 98.2 F (36.8 C)     Temp Source 02/25/20 1052 Oral     SpO2 02/25/20 1052 95 %     Weight --      Height --      Head Circumference --      Peak Flow --      Pain Score 02/25/20 1053 4     Pain Loc --      Pain Edu? --      Excl. in GC? --    No data found.  Updated Vital Signs BP 109/74 (BP Location: Right Arm)   Pulse 69   Temp 98.2 F (36.8 C) (Oral)   Resp 17   SpO2 95%   Visual Acuity Right Eye Distance:   Left Eye Distance:   Bilateral Distance:    Right Eye Near:   Left Eye Near:    Bilateral Near:     Physical Exam Vitals and nursing note reviewed.  Constitutional:      General: He is not in acute distress.    Appearance: Normal appearance. He is not ill-appearing.  HENT:     Head: Normocephalic and atraumatic.     Right Ear: Tympanic membrane and ear canal normal.     Left Ear: Tympanic membrane and ear canal normal.     Nose: No congestion or rhinorrhea.     Right Sinus: No maxillary sinus tenderness or frontal sinus tenderness.     Left Sinus: No maxillary sinus tenderness or frontal sinus tenderness.     Mouth/Throat:     Mouth: Mucous membranes are moist.     Pharynx: Uvula midline. No pharyngeal swelling, oropharyngeal exudate, posterior oropharyngeal erythema or uvula swelling.     Tonsils: No tonsillar exudate. 0 on the right. 0 on the left.  Eyes:     General: No scleral icterus.    Extraocular Movements:  Extraocular movements intact.     Conjunctiva/sclera: Conjunctivae normal.  Cardiovascular:     Rate and Rhythm: Normal rate and regular rhythm.     Heart sounds: No murmur heard.   Pulmonary:     Effort: Pulmonary effort is normal. No respiratory distress.  Breath sounds: Normal breath sounds. No wheezing or rales.  Abdominal:     General: Abdomen is flat. There is no distension.     Tenderness: There is no abdominal tenderness. There is no right CVA tenderness, left CVA tenderness or guarding.  Musculoskeletal:     Cervical back: Normal range of motion. No rigidity.  Skin:    Capillary Refill: Capillary refill takes less than 2 seconds.     Coloration: Skin is not jaundiced.     Findings: No rash.  Neurological:     General: No focal deficit present.     Mental Status: He is alert and oriented to person, place, and time.     Motor: No weakness.     Gait: Gait normal.  Psychiatric:        Mood and Affect: Mood normal.        Behavior: Behavior normal.      UC Treatments / Results  Labs (all labs ordered are listed, but only abnormal results are displayed) Labs Reviewed  SARS CORONAVIRUS 2 (TAT 6-24 HRS)  CBC WITH DIFFERENTIAL/PLATELET  BASIC METABOLIC PANEL    EKG   Radiology No results found.  Procedures Procedures (including critical care time)  Medications Ordered in UC Medications - No data to display  Initial Impression / Assessment and Plan / UC Course  I have reviewed the triage vital signs and the nursing notes.  Pertinent labs & imaging results that were available during my care of the patient were reviewed by me and considered in my medical decision making (see chart for details).     Will check BMP to assess electrolytes. Drink clear liquids, have bland diet, advance diet as tolerated. Follow up with PCP Final Clinical Impressions(s) / UC Diagnoses   Final diagnoses:  Gastroenteritis     Discharge Instructions     Drink clear  liquids Advance diet as tolerated Follow up with PCP     ED Prescriptions    Medication Sig Dispense Auth. Provider   ondansetron (ZOFRAN-ODT) 8 MG disintegrating tablet Take 1 tablet (8 mg total) by mouth every 8 (eight) hours as needed for nausea or vomiting. 20 tablet Evern Core, PA-C     PDMP not reviewed this encounter.   Evern Core, PA-C 02/25/20 1142

## 2020-02-25 NOTE — H&P (Signed)
History and Physical    Corey Mcguire TIR:[id number] DOB: January 30, 1961 DOA: 02/25/2020  PCP: Wilfrid Lund, PA  Patient coming from: Home.  Chief Complaint: Nausea vomiting and diarrhea.  HPI: Corey Mcguire is a 59 y.o. male with history of hypertension, chronic kidney disease stage III baseline creatinine around 2.2 in January 2021 has been experiencing nausea vomiting diarrhea unable to keep anything for the last 1 week and had gone to urgent care over the patient's creatinine was found to be around 15 was referred to the ER.  Patient denies any abdominal pain recent use of antibiotics or sick contacts.  Has been taking his medicines including ARB and hydrochlorothiazide.  Patient denies using any NSAIDs.  Denies eating any uncooked meat.  ED Course: In the ER patient's labs confirm creatinine of 16.7 bicarb of 19 lipase 367 CT abdomen pelvis is not showing in the acute except for some bilateral perinephric stranding.  UA is concerning for UTI patient is afebrile though denies any dysuria.  Patient started on fluids admitted for acute renal failure.  ER physician discussed with on-call nephrologist who advised at this time hydration.  Covid testing negative.  Review of Systems: As per HPI, rest all negative.   Past Medical History:  Diagnosis Date  . Anxiety    History of panic attacks 2016--improved with employment  . Hyperlipidemia 10/02/2014   Labs in [facility]:  Total:  330, Trigs:  109, HDL:  89,  LDL:  219.  did not follow up afterward  . Hypertension   . Libido, decreased 12/26/2018  . Prediabetes 10/02/2014   A1C:  5.8%  did not follow up    Past Surgical History:  Procedure Laterality Date  . KNEE SURGERY    . SHOULDER SURGERY       reports that he has never smoked. His smokeless tobacco use includes chew. He reports current alcohol use of about 10.0 - 14.0 standard drinks of alcohol per week. He reports that he does not use drugs.  Allergies  Allergen  Reactions  . Codeine Hives and Nausea Only    Family History  Problem Relation Age of Onset  . Hypertension Mother   . Diabetes Father   . Heart disease Father        Quadruple bypass    Prior to Admission medications   Medication Sig Start Date End Date Taking? Authorizing Provider  acetaminophen (TYLENOL) 500 MG tablet Take 1,000 mg by mouth every 6 (six) hours as needed for mild pain.    [provider]  amLODipine (NORVASC) 10 MG tablet TAKE ONE TABLET BY MOUTH DAILY Patient taking differently: Take 10 mg by mouth daily.  12/16/16   Julieanne Manson, MD  Cholecalciferol (VITAMIN D3) 50 MCG (2000 UT) capsule Take 4,000 Units by mouth daily.    [provider]  Cyanocobalamin (VITAMIN B-12) 3000 MCG SUBL Place 3,000 mg under the tongue daily.    [provider]  metFORMIN (GLUCOPHAGE-XR) 500 MG 24 hr tablet Take 500 mg by mouth daily with breakfast.  09/05/18   [provider]  omeprazole (PRILOSEC) 40 MG capsule Take 40 mg by mouth daily.  10/24/18   [provider]  ondansetron (ZOFRAN-ODT) 8 MG disintegrating tablet Take 1 tablet (8 mg total) by mouth every 8 (eight) hours as needed for nausea or vomiting. 02/25/20   Evern Core, PA-C  potassium chloride (MICRO-K) 10 MEQ CR capsule Take 1 capsule (10 mEq total) by mouth 2 (two)  times daily. 11/14/18   Shamleffer, Konrad DoloresIbtehal Jaralla, MD  rosuvastatin (CRESTOR) 20 MG tablet Take 20 mg by mouth daily.  08/18/18   [provider]  sildenafil (REVATIO) 20 MG tablet Take 20 mg by mouth as needed (ED).    [provider]  valsartan-hydrochlorothiazide (DIOVAN-HCT) 320-25 MG tablet Take 1 tablet by mouth daily.  08/01/18   [provider]    Physical Exam: Constitutional: Moderately built and nourished. Vitals:   02/25/20 1436 02/25/20 1836 02/25/20 2010 02/25/20 2130  BP: 117/77 117/76 118/74 109/63  Pulse: 65 (!) 57 (!) 57 65  Resp: 18 16 (!) 21 18  Temp: 98.1 F  (36.7 C)     TempSrc: Oral     SpO2: 100% 100% 100% 100%   Eyes: Anicteric no pallor. ENMT: No discharge from the ears eyes nose or mouth. Neck: No mass felt.  No neck rigidity. Respiratory: No rhonchi or crepitations. Cardiovascular: S1-S2 heard. Abdomen: Soft nontender bowel sound present. Musculoskeletal: No edema. Skin: No rash. Neurologic: Alert awake oriented to time place and person.  Moves all extremities. Psychiatric: Appears normal.  Normal affect.   Labs on Admission: I have personally reviewed following labs and imaging studies  CBC: Recent Labs  Lab 02/25/20 1111 02/25/20 2032 02/25/20 2204  WBC 7.4 5.6  --   NEUTROABS 3.7 2.8  --   HGB 13.8 11.8* 13.3  HCT 36.4* 32.5* 39.0  MCV 92.2 93.4  --   PLT 248 217  --    Basic Metabolic Panel: Recent Labs  Lab 02/25/20 1111 02/25/20 2032 02/25/20 2204  NA 130* 129* 132*  K 4.5 3.5 4.2  CL 95* 93* 98  CO2 19* 19*  --   GLUCOSE 98 96 90  BUN 42* 43* 57*  CREATININE 15.89* 16.73* 15.70*  CALCIUM 8.1* 8.1*  --    GFR: CrCl cannot be calculated (Unknown ideal weight.). Liver Function Tests: Recent Labs  Lab 02/25/20 2032  AST 15  ALT 44  ALKPHOS 132*  BILITOT 1.4*  PROT 8.2*  ALBUMIN 4.2   Recent Labs  Lab 02/25/20 2032  LIPASE 367*   No results for input(s): AMMONIA in the last 168 hours. Coagulation Profile: No results for input(s): INR, PROTIME in the last 168 hours. Cardiac Enzymes: Recent Labs  Lab 02/25/20 2032  CKTOTAL 326   BNP (last 3 results) No results for input(s): PROBNP in the last 8760 hours. HbA1C: No results for input(s): HGBA1C in the last 72 hours. CBG: No results for input(s): GLUCAP in the last 168 hours. Lipid Profile: No results for input(s): CHOL, HDL, LDLCALC, TRIG, CHOLHDL, LDLDIRECT in the last 72 hours. Thyroid Function Tests: No results for input(s): TSH, T4TOTAL, FREET4, T3FREE, THYROIDAB in the last 72 hours. Anemia Panel: No results for input(s):  VITAMINB12, FOLATE, FERRITIN, TIBC, IRON, RETICCTPCT in the last 72 hours. Urine analysis:    Component Value Date/Time   COLORURINE YELLOW 02/25/2020 2152   APPEARANCEUR HAZY (A) 02/25/2020 2152   LABSPEC 1.006 02/25/2020 2152   PHURINE 5.0 02/25/2020 2152   GLUCOSEU NEGATIVE 02/25/2020 2152   HGBUR SMALL (A) 02/25/2020 2152   BILIRUBINUR NEGATIVE 02/25/2020 2152   KETONESUR NEGATIVE 02/25/2020 2152   PROTEINUR 30 (A) 02/25/2020 2152   UROBILINOGEN 1.0 11/23/2010 0900   NITRITE NEGATIVE 02/25/2020 2152   LEUKOCYTESUR TRACE (A) 02/25/2020 2152   Sepsis Labs: @LABRCNTIP (procalcitonin:4,lacticidven:4) ) Recent Results (from the past 240 hour(s))  SARS CORONAVIRUS 2 (TAT 6-24 HRS) Nasopharyngeal Nasopharyngeal Swab  Status: None   Collection Time: 02/25/20 11:10 AM   Specimen: Nasopharyngeal Swab  Result Value Ref Range Status   SARS Coronavirus 2 NEGATIVE NEGATIVE Final    Comment: (NOTE) SARS-CoV-2 target nucleic acids are NOT DETECTED.  The SARS-CoV-2 RNA is generally detectable in upper and lower respiratory specimens during the acute phase of infection. Negative results do not preclude SARS-CoV-2 infection, do not rule out co-infections with other pathogens, and should not be used as the sole basis for treatment or other patient management decisions. Negative results must be combined with clinical observations, patient history, and epidemiological information. The expected result is Negative.  Fact Sheet for Patients: HairSlick.no  Fact Sheet for Healthcare Providers: quierodirigir.com  This test is not yet approved or cleared by the [place] FDA and  has been authorized for detection and/or diagnosis of SARS-CoV-2 by FDA under an Emergency Use Authorization (EUA). This EUA will remain  in effect (meaning this test can be used) for the duration of the COVID-19 declaration under Se ction 564(b)(1) of the  Act, 21 U.S.C. section 360bbb-3(b)(1), unless the authorization is terminated or revoked sooner.  Performed at [facility], [address], [place], [place] [address]      Radiological Exams on Admission: CT Renal Stone Study  Result Date: 02/25/2020 CLINICAL DATA:  Flank pain.  Suspect kidney stone EXAM: CT ABDOMEN AND PELVIS WITHOUT CONTRAST TECHNIQUE: Multidetector CT imaging of the abdomen and pelvis was performed following the standard protocol without IV contrast. COMPARISON:  None FINDINGS: Lower chest: Lung bases are clear. Hepatobiliary: No focal hepatic lesion. No biliary duct dilatation. Common bile duct is normal. Pancreas: Pancreas is normal. No ductal dilatation. No pancreatic inflammation. Spleen: Normal spleen Adrenals/urinary tract: Adrenal glands normal. Bilateral perinephric stranding. No nephrolithiasis or ureterolithiasis. Obstructive uropathy. No bladder calculi. Stomach/Bowel: Stomach, small bowel, appendix, and cecum are normal. The colon and rectosigmoid colon are normal. Vascular/Lymphatic: Abdominal aorta is normal caliber with atherosclerotic calcification. There is no retroperitoneal or periportal lymphadenopathy. No pelvic lymphadenopathy. Is Reproductive: Prostate normal.  Calcified vas deferens. Other: Bilateral fat filled inguinal hernias. RIGHT greater than LEFT. Musculoskeletal: Is No acute osseous abnormality. IMPRESSION: 1. No nephrolithiasis, ureterolithiasis, or obstructive uropathy. 2. Bilateral perinephric stranding is nonspecific. 3. No bladder calculi. 4. Follow-up inguinal hernias, RIGHT greater than LEFT. 5. Aortic Atherosclerosis (ICD10-I70.0). Electronically Signed   By: Genevive Bi M.D.   On: 02/25/2020 20:11     Assessment/Plan Principal Problem:   ARF (acute renal failure) (HCC) Active Problems:   Essential hypertension   Prediabetes    1. Acute on chronic kidney disease stage III likely precipitated by recent nausea vomiting and  diarrhea unable to take p.o.'s and in addition patient has been taking ARB and hydrochlorothiazide which were worsened renal function in the setting of dehydration.  For now we will hold off patient's ARB hydrochlorothiazide and continue hydration.  Follow intake output metabolic panel. 2. Nausea vomiting and diarrhea with elevated lipase.  Denies any abdominal pain.  Lipase could have been elevated in the setting of renal failure.  CT scan does not show anything acute except for mild perinephric stranding.  Will try diet.  If patient's vomiting and diarrhea persist may need further work-up.  If there is further diarrhea will need to check stool studies. 3. Hypertension we will hold ARB and hydrochlorothiazide in the setting of renal failure and continue amlodipine as needed IV hydralazine. 4. Hyperlipidemia on statins.  CK levels are normal. 5. Anemia likely  from renal disease appears to be chronic follow CBC. 6. History of prediabetes follow metabolic panel closely. 7. Abnormal UA with perinephric stranding concerning for UTI we will keep patient antibiotics.  Urine cultures.  Since patient has significantly worsening creatinine will need continued hydration and close monitoring and further work-up if creatinine does not improve and will need inpatient status.   DVT prophylaxis: Heparin. Code Status: Full code. Family Communication: Discussed with patient. Disposition Plan: Home. Consults called: ER physician discussed with on-call nephrologist. Admission status: Inpatient.   Eduard Clos MD [facility] Pager [phone].  If 7PM-7AM, please contact night-coverage [email or web address] Password [email or web address]  02/25/2020, 10:45 PM

## 2020-02-25 NOTE — ED Notes (Signed)
Patient transported to CT 

## 2020-02-25 NOTE — ED Provider Notes (Signed)
[facility] EMERGENCY DEPARTMENT Provider Note   CSN: [id number] Arrival date & time: 02/25/20  1338     History Chief Complaint  Patient presents with  . Abnormal Lab    MARCELLIUS MONTAGNA is a 59 y.o. male hx of HL, HTN, here presenting with vomiting and diarrhea.  Patient states that he has been vomiting and diarrhea for the last week or so.  He states that since he is not keeping much down he is unable to urinate several days ago.  He states that he is able to urinate today.  Denies any pain with urination.  He went to urgent care and was noted to have a creatinine of 15 and a BUN of 42.  Patient was sent here for further evaluation.  Patient has a baseline creatinine about 2.  The history is provided by the patient.       Past Medical History:  Diagnosis Date  . Anxiety    History of panic attacks 2016--improved with employment  . Hyperlipidemia 10/02/2014   Labs in [facility]:  Total:  330, Trigs:  109, HDL:  89,  LDL:  219.  did not follow up afterward  . Hypertension   . Libido, decreased 12/26/2018  . Prediabetes 10/02/2014   A1C:  5.8%  did not follow up    Patient Active Problem List   Diagnosis Date Noted  . Libido, decreased 12/26/2018  . Vitamin D deficiency 11/12/2018  . Fatigue 11/12/2018  . Low testosterone in male 11/12/2018  . Essential hypertension 01/05/2016  . Prediabetes 10/02/2014  . Hyperlipidemia 10/02/2014    Past Surgical History:  Procedure Laterality Date  . KNEE SURGERY    . SHOULDER SURGERY         Family History  Problem Relation Age of Onset  . Hypertension Mother   . Diabetes Father   . Heart disease Father        Quadruple bypass    Social History   Tobacco Use  . Smoking status: Never Smoker  . Smokeless tobacco: Current User    Types: Chew  . Tobacco comment: Chewed tobacco since age 61 yo  Substance Use Topics  . Alcohol use: Yes    Alcohol/week: 10.0 - 14.0 standard drinks    Types: 10 - 14  Cans of beer per week  . Drug use: No    Home Medications Prior to Admission medications   Medication Sig Start Date End Date Taking? Authorizing Provider  acetaminophen (TYLENOL) 500 MG tablet Take 1,000 mg by mouth every 6 (six) hours as needed for mild pain.    [provider]  amLODipine (NORVASC) 10 MG tablet TAKE ONE TABLET BY MOUTH DAILY Patient taking differently: Take 10 mg by mouth daily.  12/16/16   Julieanne Manson, MD  Cholecalciferol (VITAMIN D3) 50 MCG (2000 UT) capsule Take 4,000 Units by mouth daily.    [provider]  Cyanocobalamin (VITAMIN B-12) 3000 MCG SUBL Place 3,000 mg under the tongue daily.    [provider]  metFORMIN (GLUCOPHAGE-XR) 500 MG 24 hr tablet Take 500 mg by mouth daily with breakfast.  09/05/18   [provider]  omeprazole (PRILOSEC) 40 MG capsule Take 40 mg by mouth daily.  10/24/18   [provider]  ondansetron (ZOFRAN-ODT) 8 MG disintegrating tablet Take 1 tablet (8 mg total) by mouth every 8 (eight) hours as needed for nausea or vomiting. 02/25/20   Evern Core, PA-C  potassium chloride (MICRO-K) 10  MEQ CR capsule Take 1 capsule (10 mEq total) by mouth 2 (two) times daily. 11/14/18   Shamleffer, Konrad Dolores, MD  rosuvastatin (CRESTOR) 20 MG tablet Take 20 mg by mouth daily.  08/18/18   [provider]  sildenafil (REVATIO) 20 MG tablet Take 20 mg by mouth as needed (ED).    [provider]  valsartan-hydrochlorothiazide (DIOVAN-HCT) 320-25 MG tablet Take 1 tablet by mouth daily.  08/01/18   [provider]    Allergies    Codeine  Review of Systems   Review of Systems  Gastrointestinal: Positive for diarrhea and vomiting.  All other systems reviewed and are negative.   Physical Exam Updated Vital Signs BP 118/74   Pulse (!) 57   Temp 98.1 F (36.7 C) (Oral)   Resp (!) 21   SpO2 100%   Physical Exam Vitals and nursing note reviewed.  Constitutional:       Comments: Dehydrated  HENT:     Head: Normocephalic.     Nose: Nose normal.     Mouth/Throat:     Mouth: Mucous membranes are dry.  Eyes:     Extraocular Movements: Extraocular movements intact.     Pupils: Pupils are equal, round, and reactive to light.  Cardiovascular:     Rate and Rhythm: Normal rate and regular rhythm.     Pulses: Normal pulses.     Heart sounds: Normal heart sounds.  Pulmonary:     Effort: Pulmonary effort is normal.     Breath sounds: Normal breath sounds.  Abdominal:     General: Abdomen is flat.     Palpations: Abdomen is soft.     Comments: Right inguinal hernia that is reducible  Musculoskeletal:        General: Normal range of motion.     Cervical back: Normal range of motion and neck supple.  Skin:    General: Skin is warm.     Capillary Refill: Capillary refill takes less than 2 seconds.  Neurological:     General: No focal deficit present.     Mental Status: He is oriented to person, place, and time.  Psychiatric:        Mood and Affect: Mood normal.        Behavior: Behavior normal.     ED Results / Procedures / Treatments   Labs (all labs ordered are listed, but only abnormal results are displayed) Labs Reviewed  CBC WITH DIFFERENTIAL/PLATELET  COMPREHENSIVE METABOLIC PANEL  LIPASE, BLOOD  URINALYSIS, ROUTINE W REFLEX MICROSCOPIC  CK  I-STAT CHEM 8, ED    EKG None  Radiology CT Renal Stone Study  Result Date: 02/25/2020 CLINICAL DATA:  Flank pain.  Suspect kidney stone EXAM: CT ABDOMEN AND PELVIS WITHOUT CONTRAST TECHNIQUE: Multidetector CT imaging of the abdomen and pelvis was performed following the standard protocol without IV contrast. COMPARISON:  None FINDINGS: Lower chest: Lung bases are clear. Hepatobiliary: No focal hepatic lesion. No biliary duct dilatation. Common bile duct is normal. Pancreas: Pancreas is normal. No ductal dilatation. No pancreatic inflammation. Spleen: Normal spleen Adrenals/urinary tract: Adrenal  glands normal. Bilateral perinephric stranding. No nephrolithiasis or ureterolithiasis. Obstructive uropathy. No bladder calculi. Stomach/Bowel: Stomach, small bowel, appendix, and cecum are normal. The colon and rectosigmoid colon are normal. Vascular/Lymphatic: Abdominal aorta is normal caliber with atherosclerotic calcification. There is no retroperitoneal or periportal lymphadenopathy. No pelvic lymphadenopathy. Is Reproductive: Prostate normal.  Calcified vas deferens. Other: Bilateral fat filled inguinal hernias. RIGHT greater than LEFT. Musculoskeletal:  Is No acute osseous abnormality. IMPRESSION: 1. No nephrolithiasis, ureterolithiasis, or obstructive uropathy. 2. Bilateral perinephric stranding is nonspecific. 3. No bladder calculi. 4. Follow-up inguinal hernias, RIGHT greater than LEFT. 5. Aortic Atherosclerosis (ICD10-I70.0). Electronically Signed   By: Genevive Bi M.D.   On: 02/25/2020 20:11    Procedures Procedures (including critical care time)  Medications Ordered in ED Medications  ondansetron (ZOFRAN) injection 4 mg (4 mg Intravenous Not Given 02/25/20 2011)  sodium chloride 0.9 % bolus 1,000 mL (1,000 mLs Intravenous New Bag/Given 02/25/20 2031)    ED Course  I have reviewed the triage vital signs and the nursing notes.  Pertinent labs & imaging results that were available during my care of the patient were reviewed by me and considered in my medical decision making (see chart for details).    MDM Rules/Calculators/A&P                          KAMARON DESKINS is a 59 y.o. male here with vomiting and diarrhea.  Creatinine is 15 earlier in urgent care.  I think this is likely from prerenal from vomiting and diarrhea.  We will get a CT renal stone to rule out obstructive causes.  Will get CBC and CMP and urinalysis as well.  10 pm CT showed no hydro. Repeat labs showed Cr 16, BUN 42, Na 129, bicarb 19, K 3.5. I talked to Dr. Malen Gauze from nephrology. She recommend IVF and  repeat BMP in the morning and nephrology to see patient. Hospitalist to admit for renal failure. I think likely prerenal from dehydration.   Final Clinical Impression(s) / ED Diagnoses Final diagnoses:  None    Rx / DC Orders ED Discharge Orders    None       Charlynne Pander, MD 02/25/20 2255

## 2020-02-25 NOTE — ED Notes (Signed)
Pt denies nausea and pain at this time, pt reports feeling "hunger". Provider made aware of pt request at this time.

## 2020-02-25 NOTE — ED Triage Notes (Signed)
Pt presents with intermittent vomiting, diarrhea, headache, and congestion for over a week.

## 2020-02-25 NOTE — ED Triage Notes (Signed)
Pt seen at [facility] today for vomiting, diarrhea, headache and congestion for over a week. UC sent pt here d/t abnormal kidney function. Pt denies any hx of abnormal kidney function

## 2020-02-26 DIAGNOSIS — N170 Acute kidney failure with tubular necrosis: Secondary | ICD-10-CM

## 2020-02-26 LAB — CBC
HCT: 34.4 % — ABNORMAL LOW (ref 39.0–52.0)
Hemoglobin: 12.2 g/dL — ABNORMAL LOW (ref 13.0–17.0)
MCH: 34.2 pg — ABNORMAL HIGH (ref 26.0–34.0)
MCHC: 35.5 g/dL (ref 30.0–36.0)
MCV: 96.4 fL (ref 80.0–100.0)
Platelets: 227 10*3/uL (ref 150–400)
RBC: 3.57 MIL/uL — ABNORMAL LOW (ref 4.22–5.81)
RDW: 12.4 % (ref 11.5–15.5)
WBC: 7 10*3/uL (ref 4.0–10.5)
nRBC: 0 % (ref 0.0–0.2)

## 2020-02-26 LAB — BASIC METABOLIC PANEL
Anion gap: 14 (ref 5–15)
BUN: 45 mg/dL — ABNORMAL HIGH (ref 6–20)
CO2: 17 mmol/L — ABNORMAL LOW (ref 22–32)
Calcium: 7.4 mg/dL — ABNORMAL LOW (ref 8.9–10.3)
Chloride: 101 mmol/L (ref 98–111)
Creatinine, Ser: 14.22 mg/dL — ABNORMAL HIGH (ref 0.61–1.24)
GFR, Estimated: 4 mL/min — ABNORMAL LOW (ref >60)
Glucose, Bld: 140 mg/dL — ABNORMAL HIGH (ref 70–99)
Potassium: 3.7 mmol/L (ref 3.5–5.1)
Sodium: 132 mmol/L — ABNORMAL LOW (ref 135–145)

## 2020-02-26 LAB — HEPATIC FUNCTION PANEL
ALT: 35 U/L (ref 0–44)
AST: 15 U/L (ref 15–41)
Albumin: 3.4 g/dL — ABNORMAL LOW (ref 3.5–5.0)
Alkaline Phosphatase: 116 U/L (ref 38–126)
Bilirubin, Direct: 0.2 mg/dL (ref 0.0–0.2)
Indirect Bilirubin: 0.7 mg/dL (ref 0.3–0.9)
Total Bilirubin: 0.9 mg/dL (ref 0.3–1.2)
Total Protein: 6 g/dL — ABNORMAL LOW (ref 6.5–8.1)

## 2020-02-26 LAB — HIV ANTIBODY (ROUTINE TESTING W REFLEX): HIV Screen 4th Generation wRfx: NONREACTIVE

## 2020-02-26 LAB — PROTEIN / CREATININE RATIO, URINE
Creatinine, Urine: 66.57 mg/dL
Protein Creatinine Ratio: 0.39 mg/mg{Cre} — ABNORMAL HIGH (ref 0.00–0.15)
Total Protein, Urine: 26 mg/dL

## 2020-02-26 LAB — CREATININE, SERUM
Creatinine, Ser: 15.68 mg/dL — ABNORMAL HIGH (ref 0.61–1.24)
GFR, Estimated: 3 mL/min — ABNORMAL LOW (ref >60)

## 2020-02-26 LAB — HEMOGLOBIN A1C
Hgb A1c MFr Bld: 6 % — ABNORMAL HIGH (ref 4.8–5.6)
Mean Plasma Glucose: 125.5 mg/dL

## 2020-02-26 LAB — PHOSPHORUS: Phosphorus: 5.6 mg/dL — ABNORMAL HIGH (ref 2.5–4.6)

## 2020-02-26 MED ORDER — SODIUM CHLORIDE 0.9 % IV SOLN
1.0000 g | Freq: Every day | INTRAVENOUS | Status: DC
  Administered 2020-02-26: 1 g via INTRAVENOUS
  Filled 2020-02-26: qty 10

## 2020-02-26 MED ORDER — SODIUM CHLORIDE 0.9 % IV SOLN: INTRAVENOUS | Status: DC

## 2020-02-26 NOTE — Consult Note (Signed)
[facility] Renal Consultation Note  Requesting MD: Chaney Malling, MD Indication for Consultation:  AKI   Chief complaint: "nausea/vomiting/diarrhea"  HPI:  Corey Mcguire is a 59 y.o. male with a history of hypertension and prediabetes who presented to the hospital with a week and a half of nausea vomiting and diarrhea.  He states that he was finally able to eat some food here yesterday and keep it down.  He denies any bloody diarrhea.  He states that since coming here his urine output has picked up.  He has been on normal saline at 150 ml/hr.  He has had decreased urine output over the past.  He denies any overt difficulty urinating though.  He states sometimes has a weak urinary stream.  He denies any use of nonsteroidals.  He is on valsartan-HCTZ as below at home as below.  He states that he saw nephrology at [facility] within the past on initial visit and did some work-up but was not able to follow-up.  (Saw Dr. Arrie Aran - Cr 2 in 06/19/19; GN work-up was negative.  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  We discussed the risk benefits and indications for dialysis and he reports that he would want dialysis if it is needed and understands this may be as early as in the next 24 hours.  He had a covid test at urgent care yesterday and covid test at [facility] which was negative.  Several maternal uncles were on dialysis.  He is feeling a little better today.  He does report a metallic taste and somewhat decreased appetite.    Creatinine, Ser  Date/Time Value Ref Range Status  02/26/2020 03:33 AM 15.68 (H) 0.61 - 1.24 mg/dL Final  50/35/4656 81:27 PM 15.70 (H) 0.61 - 1.24 mg/dL Final  51/70/0174 94:49 PM 16.73 (H) 0.61 - 1.24 mg/dL Final  67/59/1638 46:65 AM 15.89 (H) 0.61 -  1.24 mg/dL Final  99/35/7017 79:39 AM 2.26 (H) 0.61 - 1.24 mg/dL Final  03/00/9233 00:76 AM 2.07 (H) 0.40 - 1.50 mg/dL Final  22/63/3354 56:25 AM 1.40 (H) 0.61 - 1.24 mg/dL Final  63/89/3734 28:76 AM 1.17 0.50 - 1.35 mg/dL Final  81/15/7262 03:55 PM 1.54 (H)  Final     PMHx:   Past Medical History:  Diagnosis Date  . Anxiety    History of panic attacks 2016--improved with employment  . Hyperlipidemia 10/02/2014   Labs in [facility]:  Total:  330, Trigs:  109, HDL:  89,  LDL:  219.  did not follow up afterward  . Hypertension   . Libido, decreased 12/26/2018  . Prediabetes 10/02/2014   A1C:  5.8%  did not follow up    Past Surgical History:  Procedure Laterality Date  . KNEE SURGERY    . SHOULDER SURGERY      Family Hx:  Family History  Problem Relation Age of Onset  . Hypertension Mother   . Diabetes Father   . Heart disease Father        Quadruple bypass  several maternal uncles were on dialysis  Social History:  reports that he has never smoked. His smokeless tobacco use includes chew. He reports current alcohol use of about 10.0 - 14.0 standard drinks of alcohol per week. He reports that he does not use drugs.  Allergies:  Allergies  Allergen Reactions  . Codeine Hives and Nausea Only    Medications: Prior to Admission medications   Medication Sig Start Date End Date Taking? Authorizing Provider  acetaminophen (TYLENOL) 500 MG tablet Take 1,000 mg by mouth every 6 (six) hours as needed for mild pain.    [provider]  amLODipine (NORVASC) 10 MG tablet TAKE ONE TABLET BY MOUTH DAILY Patient taking differently: Take 10 mg by mouth daily.  12/16/16   Julieanne Manson, MD  Cholecalciferol (VITAMIN D3) 50 MCG (2000 UT) capsule Take 4,000 Units by mouth daily.    [provider]  Cyanocobalamin (VITAMIN B-12) 3000 MCG SUBL Place 3,000 mg under the tongue daily.    [provider]  metFORMIN (GLUCOPHAGE-XR) 500 MG 24 hr tablet Take  500 mg by mouth daily with breakfast.  09/05/18   [provider]  omeprazole (PRILOSEC) 40 MG capsule Take 40 mg by mouth daily.  10/24/18   [provider]  ondansetron (ZOFRAN-ODT) 8 MG disintegrating tablet Take 1 tablet (8 mg total) by mouth every 8 (eight) hours as needed for nausea or vomiting. 02/25/20   Evern Core, PA-C  potassium chloride (MICRO-K) 10 MEQ CR capsule Take 1 capsule (10 mEq total) by mouth 2 (two) times daily. 11/14/18   Shamleffer, Konrad Dolores, MD  rosuvastatin (CRESTOR) 20 MG tablet Take 20 mg by mouth daily.  08/18/18   [provider]  sildenafil (REVATIO) 20 MG tablet Take 20 mg by mouth as needed (ED).    [provider]  valsartan-hydrochlorothiazide (DIOVAN-HCT) 320-25 MG tablet Take 1 tablet by mouth daily.  08/01/18   [provider]    I have reviewed the patient's current and reported prior to admission medications.  Labs:  BMP Latest Ref Rng & Units 02/26/2020 02/25/2020 02/25/2020  Glucose 70 - 99 mg/dL - 90 96  BUN 6 - 20 mg/dL - 63(J) 49(F)  Creatinine 0.61 - 1.24 mg/dL 02.63(Z) 85.88(F) 02.77(A)  Sodium 135 - 145 mmol/L - 132(L) 129(L)  Potassium 3.5 - 5.1 mmol/L - 4.2 3.5  Chloride 98 - 111 mmol/L - 98 93(L)  CO2 22 - 32 mmol/L - - 19(L)  Calcium 8.9 - 10.3 mg/dL - - 8.1(L)    Urinalysis    Component Value Date/Time   COLORURINE YELLOW 02/25/2020 2152   APPEARANCEUR HAZY (A) 02/25/2020 2152   LABSPEC 1.006 02/25/2020 2152   PHURINE 5.0 02/25/2020 2152   GLUCOSEU NEGATIVE 02/25/2020 2152   HGBUR SMALL (A) 02/25/2020 2152   BILIRUBINUR NEGATIVE 02/25/2020 2152   KETONESUR NEGATIVE 02/25/2020 2152   PROTEINUR 30 (A) 02/25/2020 2152   UROBILINOGEN 1.0 11/23/2010 0900   NITRITE NEGATIVE 02/25/2020 2152   LEUKOCYTESUR TRACE (A) 02/25/2020 2152     ROS:  Pertinent items noted in HPI and remainder of comprehensive ROS otherwise negative.  Physical Exam: Vitals:   02/26/20 0400 02/26/20 0500   BP: (!) 108/54 (!) 120/54  Pulse: 71 80  Resp: (!) 23 (!) 22  Temp:    SpO2: 100% 99%     General: Adult male in stretcher in no acute distress HEENT: Normocephalic atraumatic Eyes: Extraocular movements intact sclera anicteric Neck: Supple trachea midline Heart: S1-S2 no rub appreciated Lungs: Clear to auscultation and unlabored on room air Abdomen: Soft nontender nondistended normal bowel sounds Extremities: No edema appreciated no cyanosis or clubbing Skin: No rash appreciated on extremities exposed.  Somewhat decreased skin turgor Neuro: Alert and oriented x3 provides a history and follows commands  Psych normal mood and affect Genitourinary no Foley  Assessment/Plan:  # AKI  - In the setting of prolonged prerenal insults as well as in the setting of valsartan/HCTZ and in the setting of CKD.  No overt hypotension but was on anti-hypertensives at home and normotensive here off meds.  CT no hydro.  Due to duration of pre-renal insults he may have progressed to ATN.  UA does not seem consistent with GN and note on DKA chart review he had an extensive GN work-up earlier this year including a renal biopsy as above.  Strong family history of ESRD - For now no emergent indication for dialysis though anticipate need within the next 24 hours if no improvement, especially in light of the biopsy findings of interstitial fibrosis as above - though he is currently much higher than his March baseline - continue hydration - I reordered NS at 150 ml/hr and for now removed the stop time - Await BMP from today - see is ordered - Check post-void residual bladder scan and place foley if retains over 250 mL urine  - The patient would want dialysis should he develop an indication  - I will make patient n.p.o. after midnight tonight (after 12:01am on 12/9) in the event that he needs access for dialysis - Would hold valsartan-HCTZ - Daily would hold metformin - obtain urine protein/cr ratio - Would  defer his home potassium supplement for now - Ordered strict ins/outs  # CKD stage 3b -Last Cr was 2.26 and GFR low 30's (04/08/19 in [facility] system) and 2 on 06/19/19 at [facility]  # HTN  - Hold valsartan-HCTZ - Acceptable control.  Would avoid hypotension   # Nausea/vomiting/diarrhea - Continue supportive care per primary team   # Pre-DM  - Would hold metformin  - Would update hemoglobin A1c as may have progressed - see this is ordered  # normocytic anemia  - Mild - May have some factor of CKD and anticipate may worsen with hydration  Estanislado Emms 02/26/2020, 6:16 AM

## 2020-02-26 NOTE — ED Notes (Signed)
Breakfast ordered 

## 2020-02-26 NOTE — ED Notes (Signed)
Attempted to call report

## 2020-02-26 NOTE — ED Notes (Signed)
Meal tray ordered by RN

## 2020-02-26 NOTE — Progress Notes (Signed)
PROGRESS NOTE   Corey Mcguire  FKC:[id number]    DOB: 06-Nov-1960    DOA: 02/25/2020  PCP: Wilfrid Lund, PA   I have briefly reviewed patients previous medical records in [facility].  Chief Complaint  Patient presents with  . Abnormal Lab    Brief Narrative:  59 year old male with past medical history of hypertension, stage III chronic kidney disease with baseline creatinine around 2.2 in January 2021, prediabetes, hyperlipidemia, had been having diarrhea for almost a month PTA, lately with some nausea and nonbloody emesis, went to an urgent care center where patient's creatinine was found to be in excess of 15 and he was referred to the ED.  In the ED, creatinine 16.7, bicarbonate 19.  Admitted for acute renal failure.  Nephrology consulted.   Assessment & Plan:  Principal Problem:   ARF (acute renal failure) (HCC) Active Problems:   Essential hypertension   Prediabetes   Acute renal failure complicating stage IIIb chronic kidney disease:  Last known creatinine ~2 on 06/19/2019 on follow-up with Dr. Eulogio Ditch, Nephrology.  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  Did not follow-up with nephrology.  Strong family history of ESRD in 2 maternal uncles, one was on dialysis until he got a renal transplant and other 1 remains on HD.  Nephrology consulted and their input appreciated.  Acute renal failure in the setting of valsartan/HCTZ and GI losses with poor oral intake.  Due to duration of prerenal injury, he may have progressed to ATN.  CT abdomen without hydronephrosis.  UA not consistent with GN.  Currently no immediate indications for dialysis, treating with aggressive IV fluid hydration but per nephrology if no improvement, especially in the light of prior biopsy results as above, may need to go on  hemodialysis.  Discontinued valsartan-HCTZ and Metformin.  Appears nonoliguric.  Trend daily BMP.  Urine microscopy not consistent with UTI and moreover patient lacks symptoms suggestive of UTI, discontinued ceftriaxone.  Diarrhea, nausea and vomiting:  Unclear etiology.  Denies sick contacts with similar symptoms or recent antibiotic exposure.  Reports colonoscopy not long ago in [place] and reportedly told to be unremarkable.  Symptoms improved, no diarrhea in >48 hours.  Low index of suspicion for C. difficile thereby canceled stool C. difficile order and contact isolation.  Monitor closely and may need to reassess if he has recurrence of symptoms.  ?  Symptoms related to uremia.  Diet as tolerated and monitor.  Elevated lipase:  Unclear etiology.?  Related to GI symptoms as above.  Low index of suspicion for acute pancreatitis.  CT abdomen without acute findings.  Dehydration with hyponatremia:  Normal saline hydration.  Follow BMP.  Essential hypertension:  Discontinued valsartan-HCTZ.  Controlled now on amlodipine 10 mg daily.  Avoid hypotension.  Prediabetes  A1c 6.  Hold Metformin due to acute renal failure.  Hyperlipidemia:  Statins.  CK normal.  Normocytic anemia, mild  Undetermined type.  May be related to chronic kidney disease.  Follow CBC closely.  Aortic atherosclerosis:  Noted on CT abdomen.  Continue statins.  Consider antiplatelets as outpatient.  Body mass index is 31.32 kg/m.    DVT prophylaxis: heparin injection 5,000 Units Start: 02/26/20 1400     Code Status: Full Code Family Communication: None at bedside Disposition:  Status is: Inpatient  Remains inpatient appropriate because:Inpatient level of care appropriate due to severity of illness  Dispo: The patient is from: Home              Anticipated d/c is to: Home              Anticipated d/c date is: > 3 days              Patient currently is not  medically stable to d/c.        Consultants:     Procedures:     Antimicrobials:    Anti-infectives (From admission, onward)   Start     Dose/Rate Route Frequency Ordered Stop   02/26/20 0500  cefTRIAXone (ROCEPHIN) 1 g in sodium chloride 0.9 % 100 mL IVPB        1 g 200 mL/hr over 30 Minutes Intravenous Daily 02/26/20 0451          Subjective:  Patient seen this morning in ED.  Overall feels better.  Over the last few days his GI symptoms had been improving.  No further nausea or vomiting.  No BM in >24 hours.  No pain or dyspnea reported  Objective:   Vitals:   02/26/20 1515 02/26/20 1545 02/26/20 1640 02/26/20 1645  BP: (!) 110/55 120/70  129/73  Pulse: (!) 59 61  (!) 59  Resp: 12 18  19   Temp:   98.2 F (36.8 C)   TempSrc:   Oral   SpO2: 100% 100%  100%  Weight:      Height:        General exam: Pleasant young male, moderately built and overweight lying comfortably propped up in bed without distress.  Oral mucosa dry. Respiratory system: Clear to auscultation. Respiratory effort normal. Cardiovascular system: S1 & S2 heard, RRR. No JVD, murmurs, rubs, gallops or clicks. No pedal edema.  Telemetry personally reviewed: Sinus rhythm. Gastrointestinal system: Abdomen is nondistended, soft and nontender. No organomegaly or masses felt. Normal bowel sounds heard. Central nervous system: Alert and oriented. No focal neurological deficits. Extremities: Symmetric 5 x 5 power. Skin: No rashes, lesions or ulcers Psychiatry: Judgement and insight appear normal. Mood & affect appropriate.     Data Reviewed:   I have personally reviewed following labs and imaging studies   CBC: Recent Labs  Lab 02/25/20 1111 02/25/20 1111 02/25/20 2032 02/25/20 2204 02/26/20 0333  WBC 7.4  --  5.6  --  7.0  NEUTROABS 3.7  --  2.8  --   --   HGB 13.8   < > 11.8* 13.3 12.2*  HCT 36.4*   < > 32.5* 39.0 34.4*  MCV 92.2  --  93.4  --  96.4  PLT 248  --  217  --  227   < > =  values in this interval not displayed.    Basic Metabolic Panel: Recent Labs  Lab 02/25/20 1111 02/25/20 1111 02/25/20 2032 02/25/20 2204 02/26/20 0333  NA 130*   < > 129* 132* 132*  K 4.5   < > 3.5 4.2 3.7  CL 95*   < > 93* 98 101  CO2 19*  --  19*  --  17*  GLUCOSE 98   < > 96 90 140*  BUN 42*   < > 43* 57* 45*  CREATININE 15.89*   < > 16.73* 15.70* 14.22*  15.68*  CALCIUM 8.1*  --  8.1*  --  7.4*  PHOS  --   --   --   --  5.6*   < > = values in  this interval not displayed.    Liver Function Tests: Recent Labs  Lab 02/25/20 2032 02/26/20 0333  AST 15 15  ALT 44 35  ALKPHOS 132* 116  BILITOT 1.4* 0.9  PROT 8.2* 6.0*  ALBUMIN 4.2 3.4*    CBG: No results for input(s): GLUCAP in the last 168 hours.  Microbiology Studies:   Recent Results (from the past 240 hour(s))  SARS CORONAVIRUS 2 (TAT 6-24 HRS) Nasopharyngeal Nasopharyngeal Swab     Status: None   Collection Time: 02/25/20 11:10 AM   Specimen: Nasopharyngeal Swab  Result Value Ref Range Status   SARS Coronavirus 2 NEGATIVE NEGATIVE Final    Comment: (NOTE) SARS-CoV-2 target nucleic acids are NOT DETECTED.  The SARS-CoV-2 RNA is generally detectable in upper and lower respiratory specimens during the acute phase of infection. Negative results do not preclude SARS-CoV-2 infection, do not rule out co-infections with other pathogens, and should not be used as the sole basis for treatment or other patient management decisions. Negative results must be combined with clinical observations, patient history, and epidemiological information. The expected result is Negative.  Fact Sheet for Patients: HairSlick.no  Fact Sheet for Healthcare Providers: quierodirigir.com  This test is not yet approved or cleared by the [place] FDA and  has been authorized for detection and/or diagnosis of SARS-CoV-2 by FDA under an Emergency Use Authorization (EUA).  This EUA will remain  in effect (meaning this test can be used) for the duration of the COVID-19 declaration under Se ction 564(b)(1) of the Act, 21 U.S.C. section 360bbb-3(b)(1), unless the authorization is terminated or revoked sooner.  Performed at [facility], [address] [place], [place] [address]   Resp Panel by RT-PCR (Flu A&B, Covid)     Status: None   Collection Time: 02/25/20  9:57 PM   Specimen: Nasopharyngeal(NP) swabs in vial transport medium  Result Value Ref Range Status   SARS Coronavirus 2 by RT PCR NEGATIVE NEGATIVE Final    Comment: (NOTE) SARS-CoV-2 target nucleic acids are NOT DETECTED.  The SARS-CoV-2 RNA is generally detectable in upper respiratory specimens during the acute phase of infection. The lowest concentration of SARS-CoV-2 viral copies this assay can detect is 138 copies/mL. A negative result does not preclude SARS-Cov-2 infection and should not be used as the sole basis for treatment or other patient management decisions. A negative result may occur with  improper specimen collection/handling, submission of specimen other than nasopharyngeal swab, presence of viral mutation(s) within the areas targeted by this assay, and inadequate number of viral copies(<138 copies/mL). A negative result must be combined with clinical observations, patient history, and epidemiological information. The expected result is Negative.  Fact Sheet for Patients:  BloggerCourse.com  Fact Sheet for Healthcare Providers:  SeriousBroker.it  This test is no t yet approved or cleared by the [place] FDA and  has been authorized for detection and/or diagnosis of SARS-CoV-2 by FDA under an Emergency Use Authorization (EUA). This EUA will remain  in effect (meaning this test can be used) for the duration of the COVID-19 declaration under Section 564(b)(1) of the Act, 21 U.S.C.section 360bbb-3(b)(1), unless the  authorization is terminated  or revoked sooner.       Influenza A by PCR NEGATIVE NEGATIVE Final   Influenza B by PCR NEGATIVE NEGATIVE Final    Comment: (NOTE) The Xpert Xpress SARS-CoV-2/FLU/RSV plus assay is intended as an aid in the diagnosis of influenza from Nasopharyngeal swab specimens and should not be used  as a sole basis for treatment. Nasal washings and aspirates are unacceptable for Xpert Xpress SARS-CoV-2/FLU/RSV testing.  Fact Sheet for Patients: BloggerCourse.com  Fact Sheet for Healthcare Providers: SeriousBroker.it  This test is not yet approved or cleared by the [place] FDA and has been authorized for detection and/or diagnosis of SARS-CoV-2 by FDA under an Emergency Use Authorization (EUA). This EUA will remain in effect (meaning this test can be used) for the duration of the COVID-19 declaration under Section 564(b)(1) of the Act, 21 U.S.C. section 360bbb-3(b)(1), unless the authorization is terminated or revoked.  Performed at [facility], [address] [place], [place] [address]      Radiology Studies:  CT Renal Stone Study  Result Date: 02/25/2020 CLINICAL DATA:  Flank pain.  Suspect kidney stone EXAM: CT ABDOMEN AND PELVIS WITHOUT CONTRAST TECHNIQUE: Multidetector CT imaging of the abdomen and pelvis was performed following the standard protocol without IV contrast. COMPARISON:  None FINDINGS: Lower chest: Lung bases are clear. Hepatobiliary: No focal hepatic lesion. No biliary duct dilatation. Common bile duct is normal. Pancreas: Pancreas is normal. No ductal dilatation. No pancreatic inflammation. Spleen: Normal spleen Adrenals/urinary tract: Adrenal glands normal. Bilateral perinephric stranding. No nephrolithiasis or ureterolithiasis. Obstructive uropathy. No bladder calculi. Stomach/Bowel: Stomach, small bowel, appendix, and cecum are normal. The colon and rectosigmoid colon are normal.  Vascular/Lymphatic: Abdominal aorta is normal caliber with atherosclerotic calcification. There is no retroperitoneal or periportal lymphadenopathy. No pelvic lymphadenopathy. Is Reproductive: Prostate normal.  Calcified vas deferens. Other: Bilateral fat filled inguinal hernias. RIGHT greater than LEFT. Musculoskeletal: Is No acute osseous abnormality. IMPRESSION: 1. No nephrolithiasis, ureterolithiasis, or obstructive uropathy. 2. Bilateral perinephric stranding is nonspecific. 3. No bladder calculi. 4. Follow-up inguinal hernias, RIGHT greater than LEFT. 5. Aortic Atherosclerosis (ICD10-I70.0). Electronically Signed   By: Genevive Bi M.D.   On: 02/25/2020 20:11     Scheduled Meds:   . amLODipine  10 mg Oral Daily  . heparin  5,000 Units Subcutaneous Q8H  . ondansetron (ZOFRAN) IV  4 mg Intravenous Once  . rosuvastatin  20 mg Oral Daily    Continuous Infusions:   . sodium chloride Stopped (02/26/20 1315)  . sodium chloride 150 mL/hr at 02/26/20 1355  . cefTRIAXone (ROCEPHIN)  IV Stopped (02/26/20 0705)     LOS: 1 day     Marcellus Scott, MD, [place], [facility]. [facility]    To contact the attending provider between 7A-7P or the covering provider during after hours 7P-7A, please log into the web site [email or web address] and access using universal [place] password for that web site. If you do not have the password, please call the hospital operator.  02/26/2020, 5:34 PM

## 2020-02-26 NOTE — Progress Notes (Signed)
Corey Mcguire Progress Note   59 y.o. male  hypertension and prediabetes here with a week and a half of nausea vomiting and diarrhea.  He denies any bloody diarrhea.  He states that since coming here his urine output has picked up.  Denies any use of nonsteroidals but was  on valsartan-HCTZ   He states that he saw nephrology at [facility] within the past on initial visit and did some work-up but was not able to follow-up.  (Saw Dr. Arrie Aran - Cr 2 in 06/19/19; GN work-up was negative.  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    Assessment/ Plan:   # AKI  - In the setting of prolonged prerenal insults as well as in the setting of valsartan/HCTZ and in the setting of CKD.  No overt hypotension but was on anti-hypertensives at home and normotensive here off meds.  CT no hydro.  Due to duration of pre-renal insults he may have progressed to ATN.  UA does not seem consistent with GN and note on DKA chart review he had an extensive GN work-up earlier this year including a renal biopsy as above.  Strong family history of ESRD  - For now no emergent indication for dialysis; would like to wait for at least another 24hrs given the patient is comfortable without any absolute indications for RRT. - continue hydration - I would like to give another liter os  NS - Check post-void residual bladder scan and place foley if retains over 250 mL urine  - The patient would want dialysis should he develop an indication  - I will allow  patient to eat for now and make n.p.o. after midnight tonight in case  needs access for dialysis - Hold valsartan-HCTZ - Strict ins/outs  # CKD stage 3b -Last Cr was 2.26 and GFR low 30's (04/08/19 in [facility] system) and 2 on 06/19/19 at [facility]  # HTN  - Hold valsartan-HCTZ - Acceptable control.  Would avoid  hypotension and keep MAP >65   # Nausea/vomiting/diarrhea - Continue supportive care per primary team   # Pre-DM  - Would hold metformin  - Would update hemoglobin A1c as may have progressed - see this is ordered  # normocytic anemia  - Mild - May have some factor of CKD and anticipate may worsen with hydration   Subjective:   Feeling better since coming to the hospital. Denies any further diarrhea; denies n/v/ dyspnea or cough.   Objective:   BP 112/77 (BP Location: Right Arm)   Pulse 67   Temp 98.1 F (36.7 C) (Oral)   Resp 16   Ht 5\' 7"  (1.702 m)   Wt 90.7 kg   SpO2 100%   BMI 31.32 kg/m   Intake/Output Summary (Last 24 hours) at 02/26/2020 1241 Last data filed at 02/26/2020 1146 Gross per 24 hour  Intake 3037.13 ml  Output 1390 ml  Net 1647.13 ml   Weight change:   Physical Exam: GEN: NAD, A&Ox3, NCAT HEENT: No conjunctival pallor, EOMI NECK: Supple, no thyromegaly LUNGS: CTA B/L no rales, rhonchi or wheezing CV: RRR, No M/R/G ABD: SNDNT +BS  EXT: No lower extremity edema    Imaging: CT Renal Stone Study  Result Date: 02/25/2020 CLINICAL DATA:  Flank pain.  Suspect kidney stone EXAM: CT ABDOMEN AND PELVIS WITHOUT CONTRAST TECHNIQUE: Multidetector CT imaging of the abdomen and pelvis was  performed following the standard protocol without IV contrast. COMPARISON:  None FINDINGS: Lower chest: Lung bases are clear. Hepatobiliary: No focal hepatic lesion. No biliary duct dilatation. Common bile duct is normal. Pancreas: Pancreas is normal. No ductal dilatation. No pancreatic inflammation. Spleen: Normal spleen Adrenals/urinary tract: Adrenal glands normal. Bilateral perinephric stranding. No nephrolithiasis or ureterolithiasis. Obstructive uropathy. No bladder calculi. Stomach/Bowel: Stomach, small bowel, appendix, and cecum are normal. The colon and rectosigmoid colon are normal. Vascular/Lymphatic: Abdominal aorta is normal caliber with atherosclerotic  calcification. There is no retroperitoneal or periportal lymphadenopathy. No pelvic lymphadenopathy. Is Reproductive: Prostate normal.  Calcified vas deferens. Other: Bilateral fat filled inguinal hernias. RIGHT greater than LEFT. Musculoskeletal: Is No acute osseous abnormality. IMPRESSION: 1. No nephrolithiasis, ureterolithiasis, or obstructive uropathy. 2. Bilateral perinephric stranding is nonspecific. 3. No bladder calculi. 4. Follow-up inguinal hernias, RIGHT greater than LEFT. 5. Aortic Atherosclerosis (ICD10-I70.0). Electronically Signed   By: Genevive Bi M.D.   On: 02/25/2020 20:11    Labs: BMET Recent Labs  Lab 02/25/20 1111 02/25/20 2032 02/25/20 2204 02/26/20 0333  NA 130* 129* 132* 132*  K 4.5 3.5 4.2 3.7  CL 95* 93* 98 101  CO2 19* 19*  --  17*  GLUCOSE 98 96 90 140*  BUN 42* 43* 57* 45*  CREATININE 15.89* 16.73* 15.70* 14.22*  15.68*  CALCIUM 8.1* 8.1*  --  7.4*  PHOS  --   --   --  5.6*   CBC Recent Labs  Lab 02/25/20 1111 02/25/20 2032 02/25/20 2204 02/26/20 0333  WBC 7.4 5.6  --  7.0  NEUTROABS 3.7 2.8  --   --   HGB 13.8 11.8* 13.3 12.2*  HCT 36.4* 32.5* 39.0 34.4*  MCV 92.2 93.4  --  96.4  PLT 248 217  --  227    Medications:    . amLODipine  10 mg Oral Daily  . heparin  5,000 Units Subcutaneous Q8H  . ondansetron (ZOFRAN) IV  4 mg Intravenous Once  . rosuvastatin  20 mg Oral Daily      Paulene Floor, MD 02/26/2020, 12:41 PM

## 2020-02-26 NOTE — ED Notes (Signed)
Pt denies soft stools earlier week but denies malodorous or runny stools at this time. Pt made aware of need for stool sample and R/O CDIff

## 2020-02-26 NOTE — ED Notes (Signed)
Bladder scan resulted in 137 mL

## 2020-02-27 LAB — RENAL FUNCTION PANEL
Albumin: 3.3 g/dL — ABNORMAL LOW (ref 3.5–5.0)
Anion gap: 12 (ref 5–15)
BUN: 45 mg/dL — ABNORMAL HIGH (ref 6–20)
CO2: 19 mmol/L — ABNORMAL LOW (ref 22–32)
Calcium: 7.6 mg/dL — ABNORMAL LOW (ref 8.9–10.3)
Chloride: 105 mmol/L (ref 98–111)
Creatinine, Ser: 12.63 mg/dL — ABNORMAL HIGH (ref 0.61–1.24)
GFR, Estimated: 4 mL/min — ABNORMAL LOW (ref >60)
Glucose, Bld: 99 mg/dL (ref 70–99)
Phosphorus: 5.6 mg/dL — ABNORMAL HIGH (ref 2.5–4.6)
Potassium: 3.7 mmol/L (ref 3.5–5.1)
Sodium: 136 mmol/L (ref 135–145)

## 2020-02-27 LAB — CBC WITH DIFFERENTIAL/PLATELET
Abs Immature Granulocytes: 0.01 10*3/uL (ref 0.00–0.07)
Basophils Absolute: 0 10*3/uL (ref 0.0–0.1)
Basophils Relative: 1 %
Eosinophils Absolute: 0 10*3/uL (ref 0.0–0.5)
Eosinophils Relative: 1 %
HCT: 31 % — ABNORMAL LOW (ref 39.0–52.0)
Hemoglobin: 11.6 g/dL — ABNORMAL LOW (ref 13.0–17.0)
Immature Granulocytes: 0 %
Lymphocytes Relative: 35 %
Lymphs Abs: 1.8 10*3/uL (ref 0.7–4.0)
MCH: 34.6 pg — ABNORMAL HIGH (ref 26.0–34.0)
MCHC: 37.4 g/dL — ABNORMAL HIGH (ref 30.0–36.0)
MCV: 92.5 fL (ref 80.0–100.0)
Monocytes Absolute: 1 10*3/uL (ref 0.1–1.0)
Monocytes Relative: 19 %
Neutro Abs: 2.4 10*3/uL (ref 1.7–7.7)
Neutrophils Relative %: 44 %
Platelets: 248 10*3/uL (ref 150–400)
RBC: 3.35 MIL/uL — ABNORMAL LOW (ref 4.22–5.81)
RDW: 12.3 % (ref 11.5–15.5)
WBC: 5.3 10*3/uL (ref 4.0–10.5)
nRBC: 0 % (ref 0.0–0.2)

## 2020-02-27 LAB — URINE CULTURE: Culture: <10000 — AB

## 2020-02-27 LAB — CREATININE, URINE, RANDOM: Creatinine, Urine: 65.3 mg/dL

## 2020-02-27 LAB — SODIUM, URINE, RANDOM: Sodium, Ur: 84 mmol/L

## 2020-02-27 MED ORDER — VITAMIN B-12 3000 MCG SL SUBL: 3000.0000 ug | SUBLINGUAL_TABLET | Freq: Every day | SUBLINGUAL | Status: DC

## 2020-02-27 NOTE — Discharge Instructions (Addendum)

## 2020-02-27 NOTE — Discharge Summary (Signed)
Physician Discharge Summary  Corey Mcguire FWY:[id number] DOB: 09-27-1960  PCP: Wilfrid Lund, PA  Admitted from: Home Discharged to: Home  Admit date: 02/25/2020 Discharge date: 02/27/2020  Recommendations for Outpatient Follow-up:    Follow-up Information    Wilfrid Lund, [place]. Schedule an appointment as soon as possible for a visit in 1 week(s).   Specialty: Family Medicine Contact information: [address] [place] [address] [phone]        Terrial Rhodes, MD Follow up on 03/04/2020.   Specialty: Nephrology Why: 3:45 pm.  Contact information: [address] [place] [address] [phone]        [facility] Follow up on 03/02/2020.   Why: we've also faxed orders to [facility] for a chemistry panel for Monday 03/02/2020. You just needs to go to any [facility] on mon.               Home Health: None    Equipment/Devices: None    Discharge Condition: Improved and stable   Code Status: Full Code Diet recommendation:  Discharge Diet Orders (From admission, onward)    Start     Ordered   02/27/20 0000  Diet - low sodium heart healthy        02/27/20 1101   02/27/20 0000  Diet Carb Modified        02/27/20 1101           Discharge Diagnoses:  Principal Problem:   ARF (acute renal failure) (HCC) Active Problems:   Essential hypertension   Prediabetes   Brief Summary: 59 year old male with past medical history of hypertension, stage III chronic kidney disease with baseline creatinine around 2.2 in January 2021, prediabetes, hyperlipidemia, had been having diarrhea for almost a month PTA, lately with some nausea and nonbloody emesis, went to an urgent care center where patient's creatinine was found to be in excess of 15 and he was referred to the ED.  In the ED, creatinine 16.7, bicarbonate 19.  Admitted for acute renal failure.  Nephrology consulted.   Assessment & Plan:   Acute renal failure complicating stage IIIb chronic  kidney disease:  Last known creatinine ~2 on 06/19/2019 on follow-up with Dr. Eulogio Ditch, Nephrology.  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  Did not follow-up with nephrology.  Strong family history of ESRD in 2 maternal uncles, one was on dialysis until he got a renal transplant and other 1 remains on HD.  Nephrology consulted and their input appreciated.  Acute renal failure in the setting of valsartan/HCTZ and GI losses with poor oral intake.  Due to duration of prerenal injury, he may have progressed to ATN.  CT abdomen without hydronephrosis.  UA not consistent with GN.  There were no immediate indications for dialysis, treated with aggressive IV fluid hydration but per nephrology if no improvement, especially in the light of prior biopsy results as above, may need to go on hemodialysis.  Discontinued valsartan-HCTZ.  It appears that he was not on Metformin PTA.  Nonoliguric.  Urine microscopy not consistent with UTI and moreover patient lacks symptoms suggestive of UTI, discontinued ceftriaxone.  Nephrology follow-up today appreciated.  Suspect that his acute renal failure was due to valsartan/HCTZ + prerenal + hypotension.  Creatinine has improved from 16.73 on admission to 12.63 on day of discharge. They have stopped IV fluids and indicated that he should not need  RRT this hospitalization.  Diet resumed.  He put out 2290 mL urine yesterday.  I discussed with Dr. Juel BurrowLin, nephrology who has cleared him for discharge, he has arranged for patient to have lab work (chemistry panel) on 03/02/2020 and nephrology follow-up on 03/04/2020.  I have counseled patient and spouse at bedside extensively regarding importance of adequate oral hydration, avoid nephrotoxic medications and went over the list with them and to make sure to keep up the lab  work and MD follow-up appointment.  They verbalized understanding.  Diarrhea, nausea and vomiting:  Unclear etiology.  Denies sick contacts with similar symptoms or recent antibiotic exposure.  Reports colon[place] not long ago in [place] and reportedly told to be unremarkable.  Symptoms improved, no diarrhea in >48 hours.  Low index of suspicion for C. difficile thereby canceled stool C. difficile order and contact isolation.  Monitor closely and may need to reassess if he has recurrence of symptoms.  ?  Symptoms related to uremia.  Continues to do well.  No further nausea or vomiting.  Had a normal consistency BM this morning followed by a loose stool but these were after 48 hours of no BM.  Advised patient and spouse that he may try over-the-counter probiotic yogurts.  Outpatient follow-up with PCP and may consider screening colonoscopy if one not recently performed.  Elevated lipase:  Unclear etiology.?  Related to GI symptoms as above versus acute renal failure.  Low index of suspicion for acute pancreatitis.  CT abdomen without acute findings.  Dehydration with hyponatremia:  Dehydration resolved with IV fluids.  Oral intake ad lib.  Essential hypertension:  Discontinued valsartan-HCTZ.  Controlled now on amlodipine 10 mg daily.  Avoid hypotension.  Prediabetes  A1c 6.  It appears that patient was not on Metformin PTA.  Hyperlipidemia:  Statins.  CK normal.  Normocytic anemia, mild  Undetermined type.  May be related to chronic kidney disease.    Hemoglobin has dropped from 12.2-11.6, suspect due to hemodilution from IV fluids.  Follow CBC as outpatient.  Aortic atherosclerosis:  Noted on CT abdomen.  Continue statins.  Consider antiplatelets as outpatient.  Body mass index is 31.32 kg/m.  Right greater than sign left inguinal hernia  Noted on CT abdomen.  No symptoms reported.  Outpatient follow    Consultants:    Nephrology  Procedures:   None   Discharge Instructions  Discharge Instructions    Call MD for:   Complete by: As directed    Recurrent diarrhea.   Call MD for:  difficulty breathing, headache or visual disturbances   Complete by: As directed    Call MD for:  extreme fatigue   Complete by: As directed    Call MD for:  persistant dizziness or light-headedness   Complete by: As directed    Call MD for:  persistant nausea and vomiting   Complete by: As directed    Call MD for:  severe uncontrolled pain   Complete by: As directed    Call MD for:  temperature >100.4   Complete by: As directed    Diet - low sodium heart healthy   Complete by: As directed    Diet Carb Modified   Complete by: As directed    Increase activity slowly   Complete by: As directed        Medication List    STOP taking these medications   potassium chloride 10 MEQ CR capsule Commonly known as: MICRO-K   valsartan-hydrochlorothiazide 320-25 MG tablet  Commonly known as: DIOVAN-HCT     TAKE these medications   acetaminophen 500 MG tablet Commonly known as: TYLENOL Take 1,000 mg by mouth every 6 (six) hours as needed for mild pain.   amLODipine 10 MG tablet Commonly known as: NORVASC TAKE ONE TABLET BY MOUTH DAILY   omeprazole 40 MG capsule Commonly known as: PRILOSEC Take 40 mg by mouth daily.   ondansetron 8 MG disintegrating tablet Commonly known as: ZOFRAN-ODT Take 1 tablet (8 mg total) by mouth every 8 (eight) hours as needed for nausea or vomiting.   rosuvastatin 20 MG tablet Commonly known as: CRESTOR Take 20 mg by mouth daily.   sildenafil 20 MG tablet Commonly known as: REVATIO Take 20 mg by mouth daily as needed (for ED).   Vitamin B-12 3000 MCG Subl Place 3,000 mcg under the tongue daily. What changed: how much to take   Vitamin D3 50 MCG (2000 UT) capsule Take 4,000 Units by mouth daily.      Allergies  Allergen Reactions  . Codeine Hives and Nausea Only       Procedures/Studies: CT Renal Stone Study  Result Date: 02/25/2020 CLINICAL DATA:  Flank pain.  Suspect kidney stone EXAM: CT ABDOMEN AND PELVIS WITHOUT CONTRAST TECHNIQUE: Multidetector CT imaging of the abdomen and pelvis was performed following the standard protocol without IV contrast. COMPARISON:  None FINDINGS: Lower chest: Lung bases are clear. Hepatobiliary: No focal hepatic lesion. No biliary duct dilatation. Common bile duct is normal. Pancreas: Pancreas is normal. No ductal dilatation. No pancreatic inflammation. Spleen: Normal spleen Adrenals/urinary tract: Adrenal glands normal. Bilateral perinephric stranding. No nephrolithiasis or ureterolithiasis. Obstructive uropathy. No bladder calculi. Stomach/Bowel: Stomach, small bowel, appendix, and cecum are normal. The colon and rectosigmoid colon are normal. Vascular/Lymphatic: Abdominal aorta is normal caliber with atherosclerotic calcification. There is no retroperitoneal or periportal lymphadenopathy. No pelvic lymphadenopathy. Is Reproductive: Prostate normal.  Calcified vas deferens. Other: Bilateral fat filled inguinal hernias. RIGHT greater than LEFT. Musculoskeletal: Is No acute osseous abnormality. IMPRESSION: 1. No nephrolithiasis, ureterolithiasis, or obstructive uropathy. 2. Bilateral perinephric stranding is nonspecific. 3. No bladder calculi. 4. Follow-up inguinal hernias, RIGHT greater than LEFT. 5. Aortic Atherosclerosis (ICD10-I70.0). Electronically Signed   By: Genevive Bi M.D.   On: 02/25/2020 20:11      Subjective: Patient denies complaints.  Had two BMs this morning, the first one was normal consistency and the subsequent one was loose.  No abdominal pain, nausea or vomiting.  Denies any other complaints.  Discharge Exam:  Vitals:   02/26/20 1645 02/26/20 1941 02/26/20 2340 02/27/20 0453  BP: 129/73 124/90 115/76 106/67  Pulse: (!) 59 65  61  Resp: 19  18 18   Temp:  98.6 F (37 C) 98.6 F (37 C) 98.6 F  (37 C)  TempSrc:  Oral Oral Oral  SpO2: 100% 95% 98% 98%  Weight:  90.7 kg  91.5 kg  Height:  5\' 7"  (1.702 m)      General exam: Pleasant young male, moderately built and overweight seen ambulating comfortably in the room without distress.  Oral mucosa moist. Respiratory system: Clear to auscultation. Respiratory effort normal. Cardiovascular system: S1 & S2 heard, RRR. No JVD, murmurs, rubs, gallops or clicks. No pedal edema.   Telemetry personally reviewed: Sinus bradycardia in the 50s Gastrointestinal system: Abdomen is nondistended, soft and nontender. No organomegaly or masses felt. Normal bowel sounds heard. Central nervous system: Alert and oriented. No focal neurological deficits. Extremities: Symmetric 5 x 5 power. Skin:  No rashes, lesions or ulcers Psychiatry: Judgement and insight appear normal. Mood & affect appropriate.     The results of significant diagnostics from this hospitalization (including imaging, microbiology, ancillary and laboratory) are listed below for reference.     Microbiology: Recent Results (from the past 240 hour(s))  SARS CORONAVIRUS 2 (TAT 6-24 HRS) Nasopharyngeal Nasopharyngeal Swab     Status: None   Collection Time: 02/25/20 11:10 AM   Specimen: Nasopharyngeal Swab  Result Value Ref Range Status   SARS Coronavirus 2 NEGATIVE NEGATIVE Final    Comment: (NOTE) SARS-CoV-2 target nucleic acids are NOT DETECTED.  The SARS-CoV-2 RNA is generally detectable in upper and lower respiratory specimens during the acute phase of infection. Negative results do not preclude SARS-CoV-2 infection, do not rule out co-infections with other pathogens, and should not be used as the sole basis for treatment or other patient management decisions. Negative results must be combined with clinical observations, patient history, and epidemiological information. The expected result is Negative.  Fact Sheet for  Patients: HairSlick.no  Fact Sheet for Healthcare Providers: quierodirigir.com  This test is not yet approved or cleared by the [place] FDA and  has been authorized for detection and/or diagnosis of SARS-CoV-2 by FDA under an Emergency Use Authorization (EUA). This EUA will remain  in effect (meaning this test can be used) for the duration of the COVID-19 declaration under Se ction 564(b)(1) of the Act, 21 U.S.C. section 360bbb-3(b)(1), unless the authorization is terminated or revoked sooner.  Performed at [facility], [address], [place], [place] [address]   Culture, Urine     Status: Abnormal   Collection Time: 02/25/20  9:52 PM   Specimen: Urine, Random  Result Value Ref Range Status   Specimen Description URINE, RANDOM  Final   Special Requests ADDED 0108 02/26/2020  Final   Culture (A)  Final    <10,000 COLONIES/mL INSIGNIFICANT GROWTH Performed at [facility], [address] [place], [place] [address]    Report Status 02/27/2020 FINAL  Final  Resp Panel by RT-PCR (Flu A&B, Covid)     Status: None   Collection Time: 02/25/20  9:57 PM   Specimen: Nasopharyngeal(NP) swabs in vial transport medium  Result Value Ref Range Status   SARS Coronavirus 2 by RT PCR NEGATIVE NEGATIVE Final    Comment: (NOTE) SARS-CoV-2 target nucleic acids are NOT DETECTED.  The SARS-CoV-2 RNA is generally detectable in upper respiratory specimens during the acute phase of infection. The lowest concentration of SARS-CoV-2 viral copies this assay can detect is 138 copies/mL. A negative result does not preclude SARS-Cov-2 infection and should not be used as the sole basis for treatment or other patient management decisions. A negative result may occur with  improper specimen collection/handling, submission of specimen other than nasopharyngeal swab, presence of viral mutation(s) within the areas targeted by this assay,  and inadequate number of viral copies(<138 copies/mL). A negative result must be combined with clinical observations, patient history, and epidemiological information. The expected result is Negative.  Fact Sheet for Patients:  BloggerCourse.com  Fact Sheet for Healthcare Providers:  SeriousBroker.it  This test is no t yet approved or cleared by the [place] FDA and  has been authorized for detection and/or diagnosis of SARS-CoV-2 by FDA under an Emergency Use Authorization (EUA). This EUA will remain  in effect (meaning this test can be used) for the duration of the COVID-19 declaration under Section 564(b)(1) of the Act, 21 U.S.C.section  360bbb-3(b)(1), unless the authorization is terminated  or revoked sooner.       Influenza A by PCR NEGATIVE NEGATIVE Final   Influenza B by PCR NEGATIVE NEGATIVE Final    Comment: (NOTE) The Xpert Xpress SARS-CoV-2/FLU/RSV plus assay is intended as an aid in the diagnosis of influenza from Nasopharyngeal swab specimens and should not be used as a sole basis for treatment. Nasal washings and aspirates are unacceptable for Xpert Xpress SARS-CoV-2/FLU/RSV testing.  Fact Sheet for Patients: BloggerCourse.com  Fact Sheet for Healthcare Providers: SeriousBroker.it  This test is not yet approved or cleared by the [place] FDA and has been authorized for detection and/or diagnosis of SARS-CoV-2 by FDA under an Emergency Use Authorization (EUA). This EUA will remain in effect (meaning this test can be used) for the duration of the COVID-19 declaration under Section 564(b)(1) of the Act, 21 U.S.C. section 360bbb-3(b)(1), unless the authorization is terminated or revoked.  Performed at [facility], [address], [place], [place] [address]      Labs: CBC: Recent Labs  Lab 02/25/20 1111 02/25/20 2032 02/25/20 2204  02/26/20 0333 02/27/20 0254  WBC 7.4 5.6  --  7.0 5.3  NEUTROABS 3.7 2.8  --   --  2.4  HGB 13.8 11.8* 13.3 12.2* 11.6*  HCT 36.4* 32.5* 39.0 34.4* 31.0*  MCV 92.2 93.4  --  96.4 92.5  PLT 248 217  --  227 248    Basic Metabolic Panel: Recent Labs  Lab 02/25/20 1111 02/25/20 2032 02/25/20 2204 02/26/20 0333 02/27/20 0254  NA 130* 129* 132* 132* 136  K 4.5 3.5 4.2 3.7 3.7  CL 95* 93* 98 101 105  CO2 19* 19*  --  17* 19*  GLUCOSE 98 96 90 140* 99  BUN 42* 43* 57* 45* 45*  CREATININE 15.89* 16.73* 15.70* 14.22*  15.68* 12.63*  CALCIUM 8.1* 8.1*  --  7.4* 7.6*  PHOS  --   --   --  5.6* 5.6*    Liver Function Tests: Recent Labs  Lab 02/25/20 2032 02/26/20 0333 02/27/20 0254  AST 15 15  --   ALT 44 35  --   ALKPHOS 132* 116  --   BILITOT 1.4* 0.9  --   PROT 8.2* 6.0*  --   ALBUMIN 4.2 3.4* 3.3*    Hgb A1c Recent Labs    02/26/20 0333  HGBA1C 6.0*    Urinalysis    Component Value Date/Time   COLORURINE YELLOW 02/25/2020 2152   APPEARANCEUR HAZY (A) 02/25/2020 2152   LABSPEC 1.006 02/25/2020 2152   PHURINE 5.0 02/25/2020 2152   GLUCOSEU NEGATIVE 02/25/2020 2152   HGBUR SMALL (A) 02/25/2020 2152   BILIRUBINUR NEGATIVE 02/25/2020 2152   KETONESUR NEGATIVE 02/25/2020 2152   PROTEINUR 30 (A) 02/25/2020 2152   UROBILINOGEN 1.0 11/23/2010 0900   NITRITE NEGATIVE 02/25/2020 2152   LEUKOCYTESUR TRACE (A) 02/25/2020 2152   I discussed in detail with patient's spouse at bedside, updated care and answered all questions.   Time coordinating discharge: 35 minutes  SIGNED:  Marcellus Scott, MD, FACP, [facility]. [facility]  To contact the attending provider between 7A-7P or the covering provider during after hours 7P-7A, please log into the web site [email or web address] and access using universal [place] password for that web site. If you do not have the password, please call the hospital operator.

## 2020-02-27 NOTE — Progress Notes (Addendum)
[facility] Progress Note   59 y.o.male hypertension and prediabetes here with a week and a half of nausea vomiting and diarrhea. He denies any bloody diarrhea. He states that since coming here his urine output has picked up. Denies any use of nonsteroidals but was  on valsartan-HCTZ  He states that he saw nephrology at [facility] within the past on initial visit and did some work-up but was not able to follow-up.(Saw Dr. 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  Assessment/ Plan:   #AKI (creatinine was 2 in 06/09/19) - In the setting of prolonged prerenal insults as well as in the setting of valsartan/HCTZand in the setting of CKD. No overt hypotension but was on anti-hypertensives at home and normotensive here off meds. CT no hydro. Due to duration of pre-renal insults he may have progressed to ATN. UA does not seem consistent with GNand note on DKA chart review he had an extensive GN work-up earlier this year including a renal biopsy as above. Strong family history of ESRD; the patient would want dialysis should he develop an indication  -For now no emergent indication for dialysis; would like to wait for at least another 24hrs given the patient is comfortable without any absolute indications for RRT. - OK stop the hydration with isotonic fluids. -  OK for  Pt to eat  Now; looks like he shouldn't need RRT this hospitalization. - Cause of insult likely the valsartan/HCTZ + prerenal + hypotension. - Would not restart the  valsartan-HCTZ at d/c.  - F/u with Dr. Arrie Aran next Wed 12/15 [facility] @ 345PM (he no showed for his last appt); we've also faxed orders to [facility] for a chemistry panel for Monday 12/13. He just needs to go to any [facility] on  mon.  # CKD stage 3b -Last Cr was 2.26 and GFR low 30's (04/08/19 in [facility] system)and 2 on 06/19/19 at [facility]  # HTN  - Hold valsartan-HCTZ - Acceptable control. Would avoid hypotension and keep MAP >65   # Nausea/vomiting/diarrhea - Continue supportive care per primary team  # Pre-DM  - Would hold metformin  - Would update hemoglobin A1c as may have progressed - see this is ordered  # normocytic anemia  - Mild   Subjective:   Feeling better since coming to the hospital. Denies any further diarrhea; denies n/v/ dyspnea or cough. Has been voiding a lot per pt.   Objective:   BP 106/67 (BP Location: Left Arm)   Pulse 61   Temp 98.6 F (37 C) (Oral)   Resp 18   Ht 5\' 7"  (1.702 m)   Wt 91.5 kg   SpO2 98%   BMI 31.59 kg/m   Intake/Output Summary (Last 24 hours) at 02/27/2020 0730 Last data filed at 02/27/2020 0455 Gross per 24 hour  Intake 3303.74 ml  Output 2290 ml  Net 1013.74 ml   Weight change:   Physical Exam: GEN: NAD, A&Ox3, NCAT HEENT: No conjunctival pallor, EOMI NECK: Supple, no thyromegaly LUNGS: CTA B/L no rales, rhonchi or wheezing CV: RRR, No M/R/G ABD: SNDNT +BS  EXT: No lower extremity edema   Imaging: CT Renal Stone Study  Result Date: 02/25/2020 CLINICAL DATA:  Flank pain.  Suspect kidney stone EXAM: CT ABDOMEN AND PELVIS WITHOUT CONTRAST TECHNIQUE: Multidetector CT imaging of the abdomen and pelvis was  performed following the standard protocol without IV contrast. COMPARISON:  None FINDINGS: Lower chest: Lung bases are clear. Hepatobiliary: No focal hepatic lesion. No biliary duct dilatation. Common bile duct is normal. Pancreas: Pancreas is normal. No ductal dilatation. No pancreatic inflammation. Spleen: Normal spleen Adrenals/urinary tract: Adrenal glands normal. Bilateral perinephric stranding. No nephrolithiasis or ureterolithiasis. Obstructive uropathy. No bladder calculi. Stomach/Bowel: Stomach, small bowel, appendix, and cecum are  normal. The colon and rectosigmoid colon are normal. Vascular/Lymphatic: Abdominal aorta is normal caliber with atherosclerotic calcification. There is no retroperitoneal or periportal lymphadenopathy. No pelvic lymphadenopathy. Is Reproductive: Prostate normal.  Calcified vas deferens. Other: Bilateral fat filled inguinal hernias. RIGHT greater than LEFT. Musculoskeletal: Is No acute osseous abnormality. IMPRESSION: 1. No nephrolithiasis, ureterolithiasis, or obstructive uropathy. 2. Bilateral perinephric stranding is nonspecific. 3. No bladder calculi. 4. Follow-up inguinal hernias, RIGHT greater than LEFT. 5. Aortic Atherosclerosis (ICD10-I70.0). Electronically Signed   By: Genevive Bi M.D.   On: 02/25/2020 20:11    Labs: BMET Recent Labs  Lab 02/25/20 1111 02/25/20 2032 02/25/20 2204 02/26/20 0333 02/27/20 0254  NA 130* 129* 132* 132* 136  K 4.5 3.5 4.2 3.7 3.7  CL 95* 93* 98 101 105  CO2 19* 19*  --  17* 19*  GLUCOSE 98 96 90 140* 99  BUN 42* 43* 57* 45* 45*  CREATININE 15.89* 16.73* 15.70* 14.22*  15.68* 12.63*  CALCIUM 8.1* 8.1*  --  7.4* 7.6*  PHOS  --   --   --  5.6* 5.6*   CBC Recent Labs  Lab 02/25/20 1111 02/25/20 2032 02/25/20 2204 02/26/20 0333 02/27/20 0254  WBC 7.4 5.6  --  7.0 5.3  NEUTROABS 3.7 2.8  --   --  2.4  HGB 13.8 11.8* 13.3 12.2* 11.6*  HCT 36.4* 32.5* 39.0 34.4* 31.0*  MCV 92.2 93.4  --  96.4 92.5  PLT 248 217  --  227 248    Medications:    . amLODipine  10 mg Oral Daily  . heparin  5,000 Units Subcutaneous Q8H  . ondansetron (ZOFRAN) IV  4 mg Intravenous Once  . rosuvastatin  20 mg Oral Daily      Paulene Floor, MD 02/27/2020, 7:30 AM

## 2020-03-24 DIAGNOSIS — M25519 Pain in unspecified shoulder: Secondary | ICD-10-CM | POA: Insufficient documentation

## 2020-07-07 ENCOUNTER — Encounter (HOSPITAL_COMMUNITY): Payer: Self-pay | Admitting: Emergency Medicine

## 2020-07-07 ENCOUNTER — Other Ambulatory Visit: Payer: Self-pay

## 2020-07-07 ENCOUNTER — Ambulatory Visit (HOSPITAL_COMMUNITY)
Admission: EM | Admit: 2020-07-07 | Discharge: 2020-07-07 | Disposition: A | Discharge status: Home / Self Care | Payer: BC Managed Care – PPO | Attending: Student | Admitting: Student

## 2020-07-07 DIAGNOSIS — Z9889 Other specified postprocedural states: Secondary | ICD-10-CM | POA: Diagnosis not present

## 2020-07-07 DIAGNOSIS — S46001A Unspecified injury of muscle(s) and tendon(s) of the rotator cuff of right shoulder, initial encounter: Secondary | ICD-10-CM | POA: Diagnosis not present

## 2020-07-07 MED ORDER — TIZANIDINE HCL 2 MG PO CAPS: 2.0000 mg | ORAL_CAPSULE | Freq: Three times a day (TID) | ORAL | 0 refills | Status: DC

## 2020-07-07 NOTE — ED Triage Notes (Signed)
Pt c/o right arm/shoulder pain onset 2 days  Reports he had a rotator cuff repair in 2015 and since then, he has been having pain.... follows up w/Ortho  Report pain increases w/activity or when lying on it.   Denies any recent inj/trauma, chest pain, SOB, dyspnea  Has appt w/ortho Monday 4/25  A&O x4... NAD.Marland Kitchen. ambulatory

## 2020-07-07 NOTE — Discharge Instructions (Addendum)
-  Start the muscle relaxer-Zanaflex (tizanidine), up to 3 times daily for muscle spasms and pain.  This can make you drowsy, so take at bedtime or when you do not need to drive or operate machinery. -Tylenol 1000mg  up to 3x daily for pain. This is okay to take with your history of kidney disease.  -Heat/ice -Follow-up with your orthopedist as soon as possible! Give them a call and see if they can move your appointment sooner.

## 2020-07-07 NOTE — ED Provider Notes (Signed)
[facility]    CSN: [id number] Arrival date & time: 07/07/20  1501      History   Chief Complaint Chief Complaint  Patient presents with  . Arm Pain    HPI Corey Mcguire is a 60 y.o. male presenting with R rotator cuff issue.  Medical history of rotator cuff surgery in 2015.  States surgery seem to go well and he had no issues with this, until 2 days ago when he woke up with significant right shoulder pain.  States he can barely move his arm.  Denies absolutely any trauma.  Denies sensation changes.  Has not tried any medications or treatments for the pain given his history of kidney injury.  He has an appointment with his orthopedist and 5 days (4/25) but states he cannot wait until then for relief.   HPI  Past Medical History:  Diagnosis Date  . Anxiety    History of panic attacks 2016--improved with employment  . Hyperlipidemia 10/02/2014   Labs in [facility]:  Total:  330, Trigs:  109, HDL:  89,  LDL:  219.  did not follow up afterward  . Hypertension   . Libido, decreased 12/26/2018  . Prediabetes 10/02/2014   A1C:  5.8%  did not follow up    Patient Active Problem List   Diagnosis Date Noted  . ARF (acute renal failure) (HCC) 02/25/2020  . Libido, decreased 12/26/2018  . Vitamin D deficiency 11/12/2018  . Fatigue 11/12/2018  . Low testosterone in male 11/12/2018  . Essential hypertension 01/05/2016  . Prediabetes 10/02/2014  . Hyperlipidemia 10/02/2014    Past Surgical History:  Procedure Laterality Date  . KNEE SURGERY    . SHOULDER SURGERY         Home Medications    Prior to Admission medications   Medication Sig Start Date End Date Taking? Authorizing Provider  tizanidine (ZANAFLEX) 2 MG capsule Take 1 capsule (2 mg total) by mouth 3 (three) times daily. 07/07/20  Yes Corey Martini, PA-C  acetaminophen (TYLENOL) 500 MG tablet Take 1,000 mg by mouth every 6 (six) hours as needed for mild pain.    [provider]   amLODipine (NORVASC) 10 MG tablet TAKE ONE TABLET BY MOUTH DAILY Patient taking differently: Take 10 mg by mouth daily.  12/16/16   Julieanne Manson, MD  Cholecalciferol (VITAMIN D3) 50 MCG (2000 UT) capsule Take 4,000 Units by mouth daily.    [provider]  Cyanocobalamin (VITAMIN B-12) 3000 MCG SUBL Place 3,000 mcg under the tongue daily. 02/27/20   Hongalgi, Maximino Greenland, MD  omeprazole (PRILOSEC) 40 MG capsule Take 40 mg by mouth daily.  10/24/18   [provider]  ondansetron (ZOFRAN-ODT) 8 MG disintegrating tablet Take 1 tablet (8 mg total) by mouth every 8 (eight) hours as needed for nausea or vomiting. 02/25/20   Evern Core, PA-C  rosuvastatin (CRESTOR) 20 MG tablet Take 20 mg by mouth daily.  08/18/18   [provider]  sildenafil (REVATIO) 20 MG tablet Take 20 mg by mouth daily as needed (for ED).     [provider]    Family History Family History  Problem Relation Age of Onset  . Hypertension Mother   . Diabetes Father   . Heart disease Father        Quadruple bypass    Social History Social History   Tobacco Use  . Smoking status: Never Smoker  . Smokeless tobacco: Current User  Types: Chew  . Tobacco comment: Chewed tobacco since age 67 yo  Substance Use Topics  . Alcohol use: Yes    Alcohol/week: 10.0 - 14.0 standard drinks    Types: 10 - 14 Cans of beer per week  . Drug use: No     Allergies   Codeine   Review of Systems Review of Systems  Constitutional: Negative for chills, fever and unexpected weight change.  Respiratory: Negative for chest tightness and shortness of breath.   Cardiovascular: Negative for chest pain and palpitations.  Gastrointestinal: Negative for abdominal pain, diarrhea, nausea and vomiting.  Genitourinary: Negative for decreased urine volume, difficulty urinating and frequency.  Musculoskeletal: Negative for arthralgias, back pain, gait problem, joint swelling, myalgias, neck pain and neck  stiffness.       R shoulder pain  Skin: Negative for wound.  Neurological: Negative for dizziness, tremors, seizures, syncope, facial asymmetry, speech difficulty, weakness, light-headedness, numbness and headaches.  All other systems reviewed and are negative.    Physical Exam Triage Vital Signs ED Triage Vitals  Enc Vitals Group     BP 07/07/20 1557 (!) 152/89     Pulse Rate 07/07/20 1557 (!) 106     Resp 07/07/20 1557 18     Temp 07/07/20 1557 98.8 F (37.1 C)     Temp Source 07/07/20 1557 Oral     SpO2 07/07/20 1557 100 %     Weight --      Height --      Head Circumference --      Peak Flow --      Pain Score 07/07/20 1601 9     Pain Loc --      Pain Edu? --      Excl. in GC? --    No data found.  Updated Vital Signs BP (!) 152/89 (BP Location: Left Arm)   Pulse (!) 106   Temp 98.8 F (37.1 C) (Oral)   Resp 18   SpO2 100%   Visual Acuity Right Eye Distance:   Left Eye Distance:   Bilateral Distance:    Right Eye Near:   Left Eye Near:    Bilateral Near:     Physical Exam Vitals reviewed.  Constitutional:      General: He is not in acute distress.    Appearance: Normal appearance. He is not ill-appearing or diaphoretic.  HENT:     Head: Normocephalic and atraumatic.  Cardiovascular:     Rate and Rhythm: Normal rate and regular rhythm.     Heart sounds: Normal heart sounds.  Pulmonary:     Effort: Pulmonary effort is normal.     Breath sounds: Normal breath sounds.  Musculoskeletal:     Comments: Exam limited due to pain. R shoulder with diffuse tenderness. ROM limited due to pain. Positive empty beer can test. Grip strength 5/5. Radial pulse 2+, cap refill <2 seconds. Neurovascularly intact.  Skin:    General: Skin is warm.     Capillary Refill: Capillary refill takes less than 2 seconds.  Neurological:     General: No focal deficit present.     Mental Status: He is alert and oriented to person, place, and time.  Psychiatric:        Mood and  Affect: Mood normal.        Behavior: Behavior normal.        Thought Content: Thought content normal.        Judgment: Judgment normal.  UC Treatments / Results  Labs (all labs ordered are listed, but only abnormal results are displayed) Labs Reviewed - No data to display  EKG   Radiology No results found.  Procedures Procedures (including critical care time)  Medications Ordered in UC Medications - No data to display  Initial Impression / Assessment and Plan / UC Course  I have reviewed the triage vital signs and the nursing notes.  Pertinent labs & imaging results that were available during my care of the patient were reviewed by me and considered in my medical decision making (see chart for details).     This patient is a 60 year old male presenting with R rotator cuff issue. H/o surgery on this 2015. He already has f/u appt with orthopedist on 4/25. Has not taken anything for pain. H/o kidney injury 02/2020.   Given no trauma and he is neurovascularly intact, will defer imaging to orthopedist. Encouraged him to call them and try to move his appt sooner.  As he has not tried any medications for pain, will start treatment with trial of tylenol and zanaflex as below. Would be reasonable to escalate to toradol or similar if symptoms persist and he cannot be seen by ortho  ED return precautions discussed.  Final Clinical Impressions(s) / UC Diagnoses   Final diagnoses:  Injury of right rotator cuff, initial encounter  History of rotator cuff surgery     Discharge Instructions     -Start the muscle relaxer-Zanaflex (tizanidine), up to 3 times daily for muscle spasms and pain.  This can make you drowsy, so take at bedtime or when you do not need to drive or operate machinery. -Tylenol 1000mg  up to 3x daily for pain. This is okay to take with your history of kidney disease.  -Heat/ice -Follow-up with your orthopedist as soon as possible! Give them a call and see  if they can move your appointment sooner.    ED Prescriptions    Medication Sig Dispense Auth. Provider   tizanidine (ZANAFLEX) 2 MG capsule Take 1 capsule (2 mg total) by mouth 3 (three) times daily. 21 capsule , PA-C     PDMP not reviewed this encounter.   Corey Martini, PA-C 07/07/20 1643

## 2020-09-23 ENCOUNTER — Ambulatory Visit: Payer: BC Managed Care – PPO | Admitting: Podiatrist

## 2020-09-23 ENCOUNTER — Encounter: Payer: Self-pay | Admitting: Podiatrist

## 2020-09-23 ENCOUNTER — Other Ambulatory Visit: Payer: Self-pay

## 2020-09-23 DIAGNOSIS — D509 Iron deficiency anemia, unspecified: Secondary | ICD-10-CM | POA: Insufficient documentation

## 2020-09-23 DIAGNOSIS — E119 Type 2 diabetes mellitus without complications: Secondary | ICD-10-CM | POA: Insufficient documentation

## 2020-09-23 DIAGNOSIS — R630 Anorexia: Secondary | ICD-10-CM | POA: Insufficient documentation

## 2020-09-23 DIAGNOSIS — N1832 Chronic kidney disease, stage 3b: Secondary | ICD-10-CM | POA: Insufficient documentation

## 2020-09-23 DIAGNOSIS — L602 Onychogryphosis: Secondary | ICD-10-CM

## 2020-09-23 DIAGNOSIS — E1169 Type 2 diabetes mellitus with other specified complication: Secondary | ICD-10-CM | POA: Insufficient documentation

## 2020-09-23 DIAGNOSIS — M109 Gout, unspecified: Secondary | ICD-10-CM | POA: Insufficient documentation

## 2020-09-23 DIAGNOSIS — N529 Male erectile dysfunction, unspecified: Secondary | ICD-10-CM | POA: Insufficient documentation

## 2020-09-23 DIAGNOSIS — B351 Tinea unguium: Secondary | ICD-10-CM

## 2020-09-23 DIAGNOSIS — K219 Gastro-esophageal reflux disease without esophagitis: Secondary | ICD-10-CM | POA: Insufficient documentation

## 2020-09-23 DIAGNOSIS — D539 Nutritional anemia, unspecified: Secondary | ICD-10-CM | POA: Insufficient documentation

## 2020-09-23 DIAGNOSIS — M19019 Primary osteoarthritis, unspecified shoulder: Secondary | ICD-10-CM | POA: Insufficient documentation

## 2020-09-23 DIAGNOSIS — K2100 Gastro-esophageal reflux disease with esophagitis, without bleeding: Secondary | ICD-10-CM | POA: Insufficient documentation

## 2020-09-23 DIAGNOSIS — K760 Fatty (change of) liver, not elsewhere classified: Secondary | ICD-10-CM | POA: Insufficient documentation

## 2020-09-23 DIAGNOSIS — E1122 Type 2 diabetes mellitus with diabetic chronic kidney disease: Secondary | ICD-10-CM | POA: Insufficient documentation

## 2020-09-23 NOTE — Patient Instructions (Signed)
Edwena Bunde is over the counter nail treatment that helps make the nail softer-  vicks vapo rub also helps soften

## 2020-09-30 NOTE — Progress Notes (Signed)
Chief Complaint  Patient presents with   Nail Problem    Rt hallux nail discolored, thick and growing sideways x 20 yrs hit nail + loose nail -no redness/swelling/drainage tx: none     HPI: Patient is 60 y.o. male with type 2 diabetes who presents today for the concerns as listed above. He relates the toenail has been growing sideways for over 20 years.  He  relates the nail is thick and feels like it is loosening and painful with pressure.  He denies any self treatment.   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   Fatty liver 09/23/2020   Gastroesophageal reflux disease without esophagitis 09/23/2020   Gout 09/23/2020   Iron deficiency anemia 09/23/2020   Localized, primary osteoarthritis of shoulder region 09/23/2020   Loss of appetite 09/23/2020   Type 2 diabetes mellitus with other specified complication (HCC) 09/23/2020   Shoulder joint pain 03/24/2020   ARF (acute renal failure) (HCC) 02/25/2020   Libido, decreased 12/26/2018   Vitamin D deficiency 11/12/2018   Fatigue 11/12/2018   Low testosterone in male 11/12/2018   Essential hypertension 01/05/2016   Prediabetes 10/02/2014   Hyperlipidemia 10/02/2014    Current Outpatient Medications on File Prior to Visit  Medication Sig Dispense Refill   amLODipine (NORVASC) 10 MG tablet TAKE ONE TABLET BY MOUTH DAILY (Patient taking differently: Take 10 mg by mouth daily.) 90 tablet 10   Cholecalciferol (VITAMIN D3) 50 MCG (2000 UT) capsule Take 4,000 Units by mouth daily.     Cyanocobalamin (VITAMIN B-12) 3000 MCG SUBL Place 3,000 mcg under the tongue daily.     omeprazole (PRILOSEC) 40 MG capsule Take 40 mg by mouth daily.      rosuvastatin (CRESTOR) 20 MG tablet Take 20 mg by mouth daily.      valsartan-hydrochlorothiazide (DIOVAN-HCT) 80-12.5 MG tablet Take 1 tablet by mouth daily.     Cholecalciferol (VITAMIN  D3) 50 MCG (2000 UT) TABS 1 tablet     No current facility-administered medications on file prior to visit.    Allergies  Allergen Reactions   Codeine Hives and Nausea Only   Tramadol Hcl Other (See Comments)    Review of Systems No fevers, chills, nausea, muscle aches, no difficulty breathing, no calf pain, no chest pain or shortness of breath.   Physical Exam  GENERAL APPEARANCE: Alert, conversant. Appropriately groomed. No acute distress.   VASCULAR: Pedal pulses palpable DP and PT bilateral.  Capillary refill time is immediate to all digits,  Proximal to distal cooling it warm to warm.  Digital perfusion adequate.   NEUROLOGIC: sensation is intact to 5.07 monofilament at 5/5 sites bilateral.  Light touch is intact bilateral, vibratory sensation intact bilateral  MUSCULOSKELETAL: acceptable muscle strength, tone and stability bilateral.  No gross boney pedal deformities noted.  No pain, crepitus or limitation noted with foot and ankle range of motion bilateral.   DERMATOLOGIC: skin is warm, supple, and dry.  No open lesions noted.  No rash, no pre ulcerative lesions. Right hallux nail is thick, discolored, pincer type deformity and nail growing sideways.  No redness, no swelling, no sign of infection noted. Remainder of nails not bothersome at today's visit.    Assessment   1. Onychomycosis of right great toe   2. Onychogryposis of toenail      Plan  Exam finding and treatment options discussed with the  patient.  I discussed trimming the nail back significantly vs. Permanent removal of the toenail.  At todays visit, we agreed on the trimming of the nail.  This was accomplished with nail nippers and a power burr.  Discussed that in the future should the nail continue to be bothersome, a total nail avulsion- permanent would be an option.  The patient will be seen back in the future as needed and will call if any questions or concerns arise.

## 2021-04-06 ENCOUNTER — Other Ambulatory Visit (HOSPITAL_COMMUNITY): Payer: Self-pay | Admitting: Family Medicine

## 2021-04-06 DIAGNOSIS — R011 Cardiac murmur, unspecified: Secondary | ICD-10-CM

## 2021-04-14 ENCOUNTER — Other Ambulatory Visit: Payer: Self-pay

## 2021-04-14 ENCOUNTER — Ambulatory Visit (HOSPITAL_COMMUNITY)
Admission: RE | Admit: 2021-04-14 | Discharge: 2021-04-14 | Disposition: A | Discharge status: Home / Self Care | Payer: BC Managed Care – PPO | Source: Ambulatory Visit | Attending: Family Medicine | Admitting: Family Medicine

## 2021-04-14 DIAGNOSIS — R011 Cardiac murmur, unspecified: Secondary | ICD-10-CM | POA: Insufficient documentation

## 2021-04-14 DIAGNOSIS — I10 Essential (primary) hypertension: Secondary | ICD-10-CM | POA: Insufficient documentation

## 2021-04-14 DIAGNOSIS — E785 Hyperlipidemia, unspecified: Secondary | ICD-10-CM | POA: Insufficient documentation

## 2021-04-14 LAB — ECHOCARDIOGRAM COMPLETE
Area-P 1/2: 4.21 cm2
S' Lateral: 3.6 cm

## 2021-04-17 IMAGING — US US ABDOMEN LIMITED
1 series · 14 of 25 positions shown · non-contrast
Comparison: None.

CLINICAL DATA: Elevated LFTs.

EXAM:
ULTRASOUND ABDOMEN LIMITED RIGHT UPPER QUADRANT

[Series 1: us abdomen limited · 0.23mm/px · 14 of 42 slices shown]
[im 1/42]
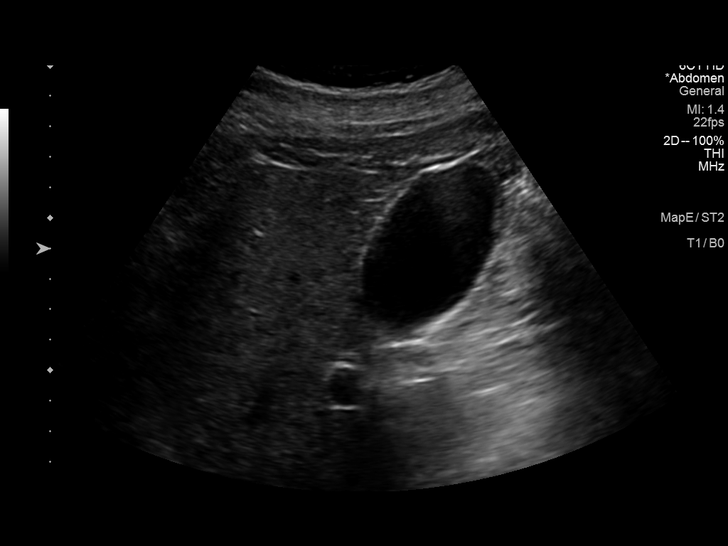
[im 4/42]
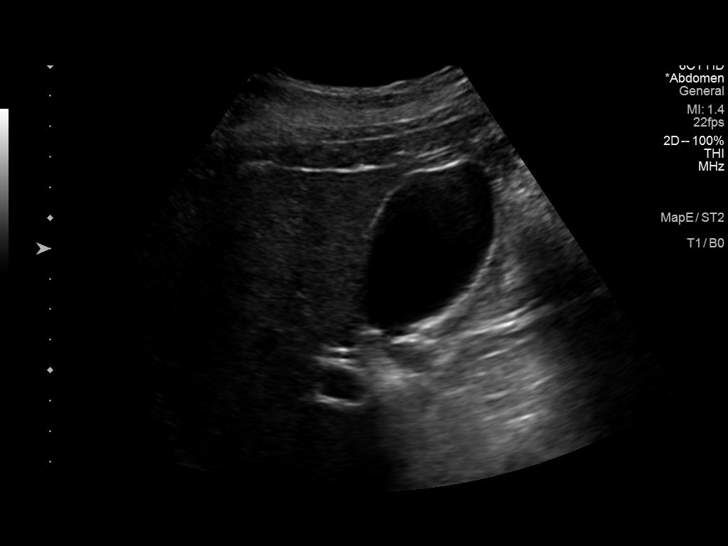
[im 7/42]
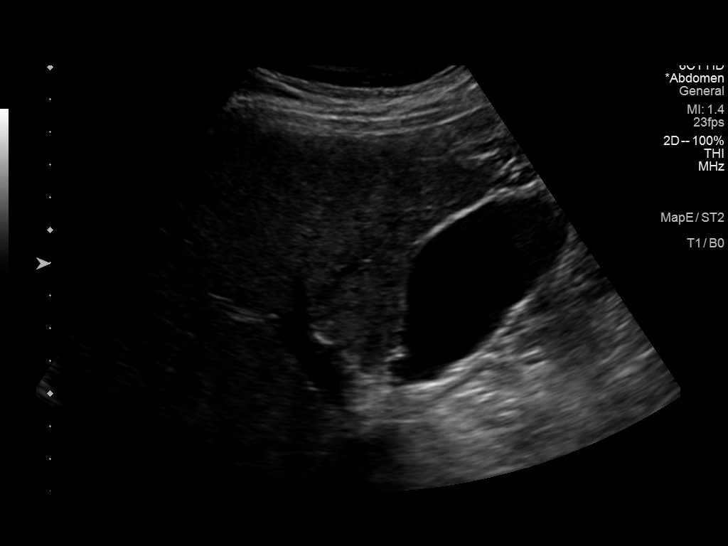
[im 11/42]
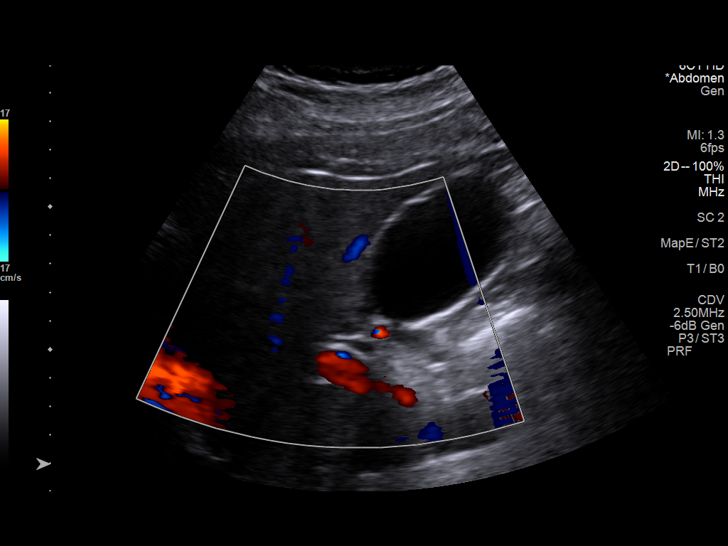
[im 14/42]
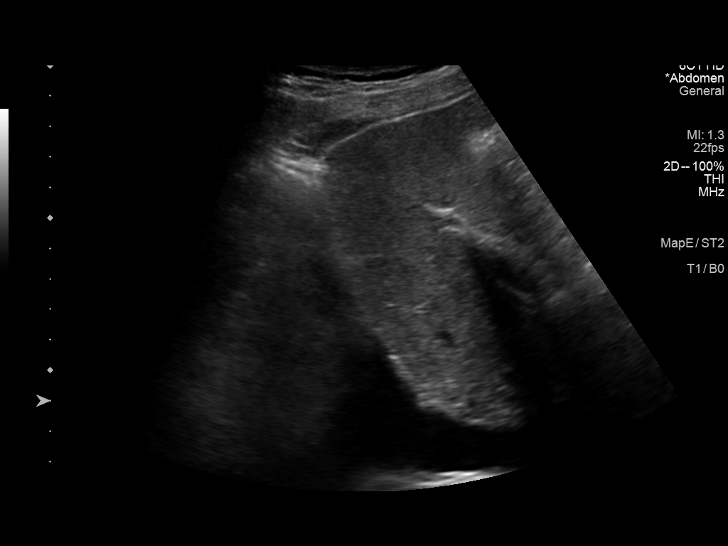
[im 16/42]
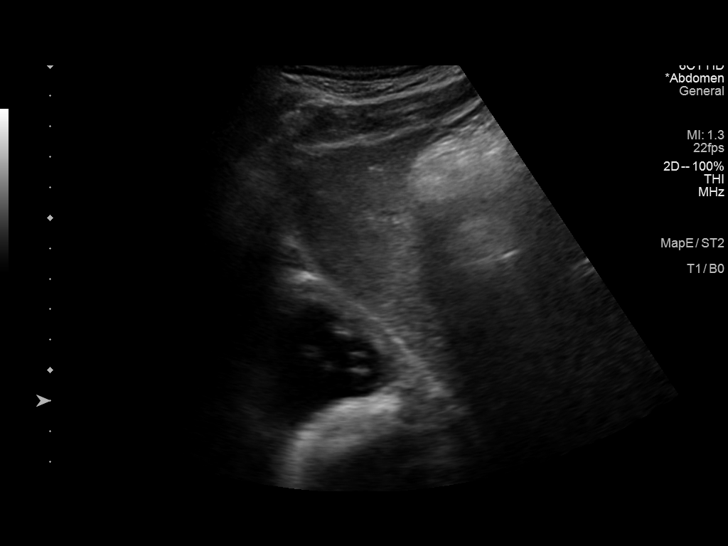
[im 19/42]
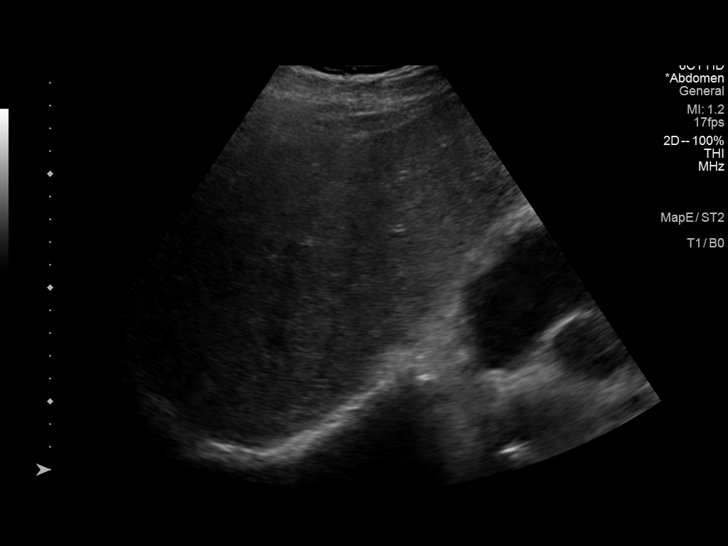
[im 23/42]
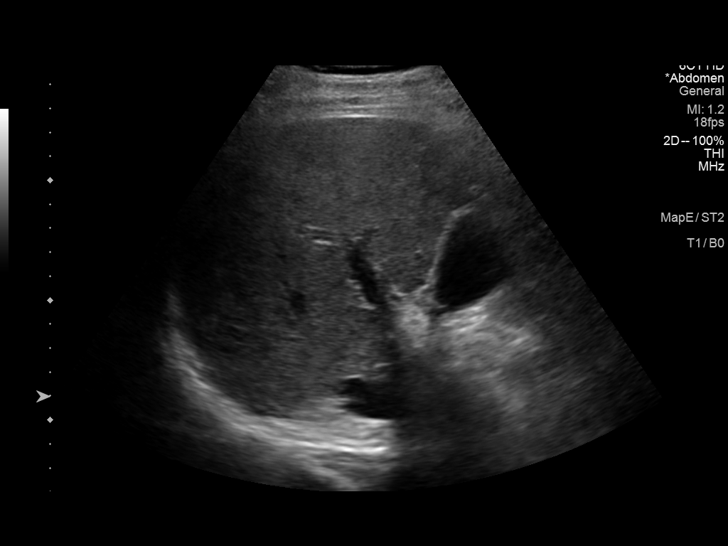
[im 26/42]
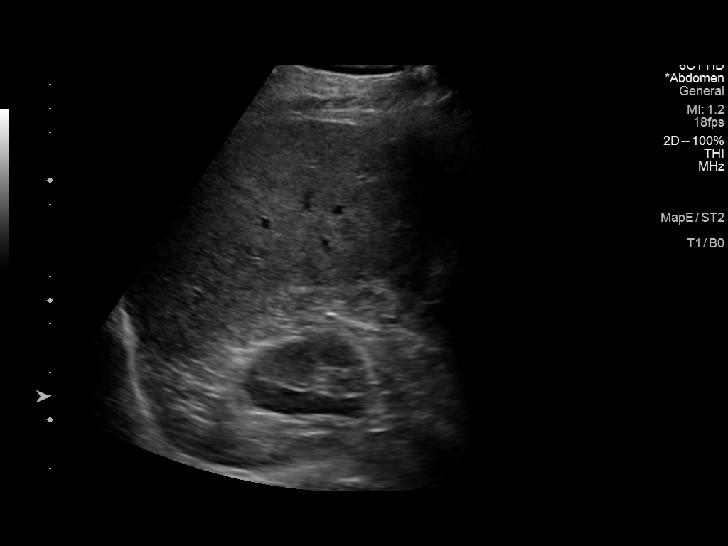
[im 28/42]
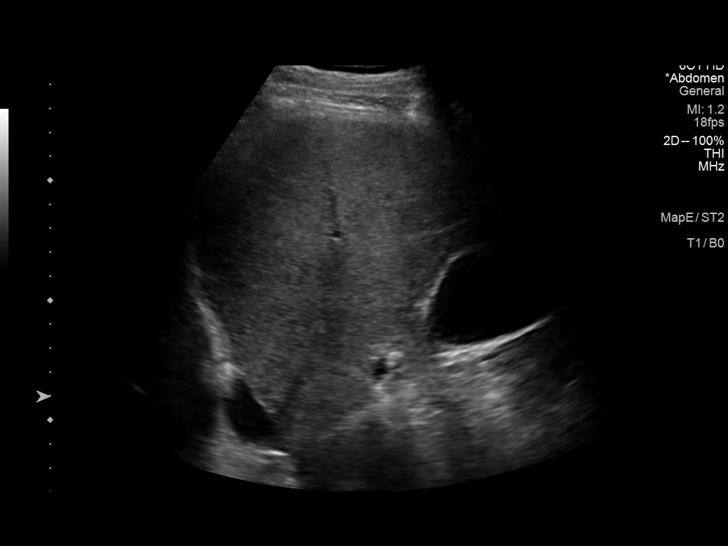
[im 31/42]
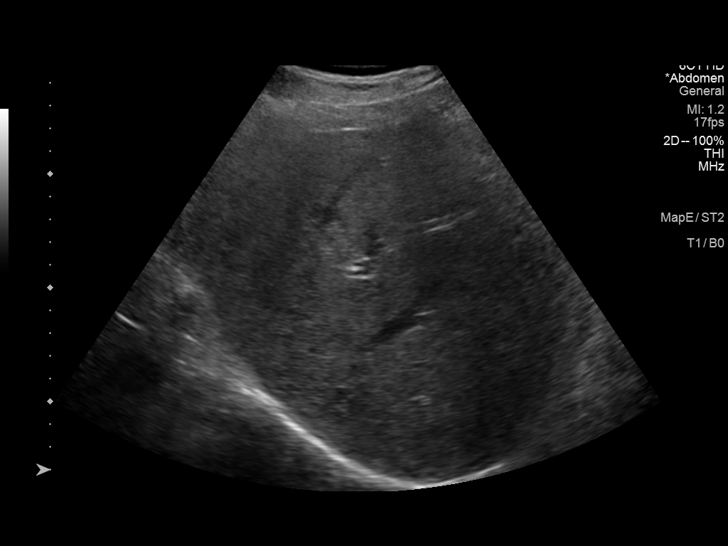
[im 35/42]
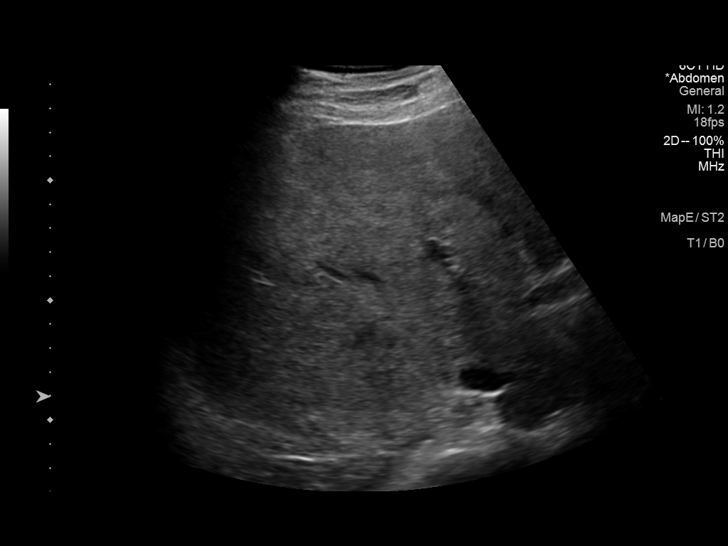
[im 38/42]
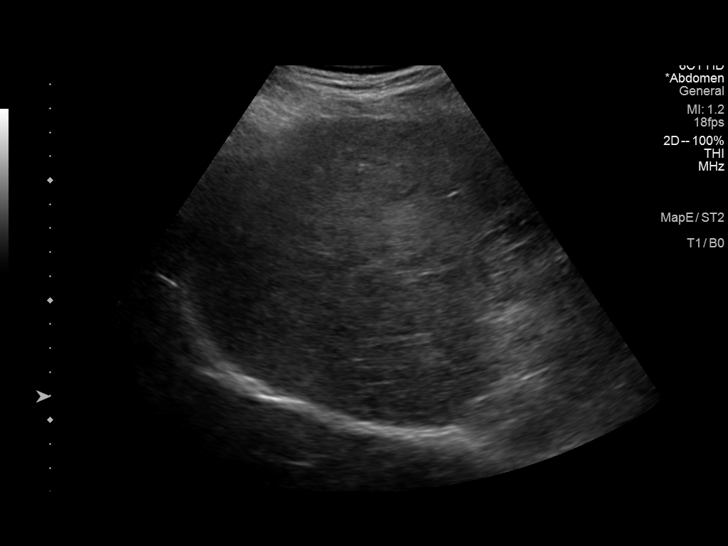
[im 42/42]
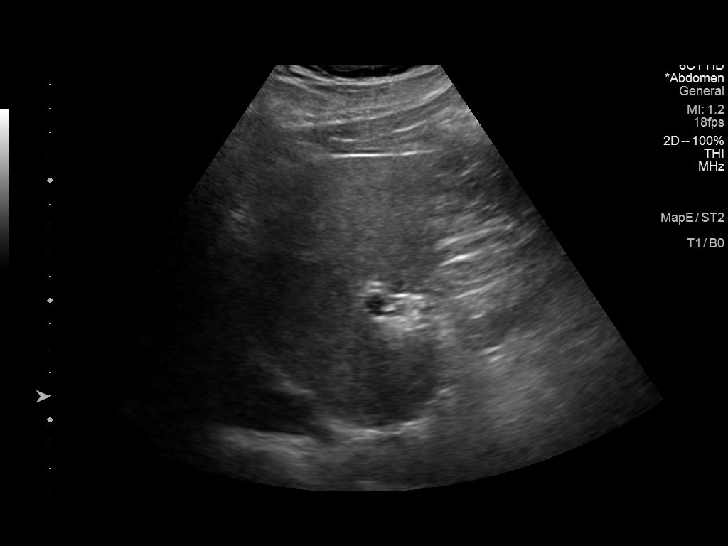

[14 of 25 positions shown; findings below may reference images not displayed]

FINDINGS: Gallbladder:

No gallstones or wall thickening visualized. No sonographic Murphy
sign noted by sonographer.

Common bile duct:

Diameter: 4 mm, normal.

Liver:

No focal lesion identified. Heterogeneously increased parenchymal
echogenicity. Portal vein is patent on color Doppler imaging with
normal direction of blood flow towards the liver.

Other: None.
IMPRESSION: 1. Heterogeneously increased hepatic parenchyma, suggestive of
underlying liver disease and/or steatosis.

## 2021-10-20 IMAGING — CT CT RENAL STONE PROTOCOL
2 of 7 series · 14 of 46 positions shown, 18 images · non-contrast
Comparison: None

CLINICAL DATA: Flank pain.  Suspect kidney stone

EXAM:
CT ABDOMEN AND PELVIS WITHOUT CONTRAST
TECHNIQUE: Multidetector CT imaging of the abdomen and pelvis was performed
following the standard protocol without IV contrast.

[Series 3: renal stone 5.0 · axial · 0.84mm/px · z∈[-143,+277]mm · 11 of 99 slices shown, 15 images]
[im 10/99  soft-tissue]
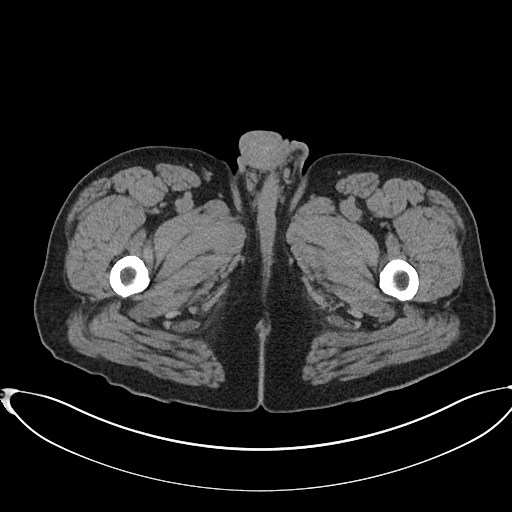
[im 10/99  bone]
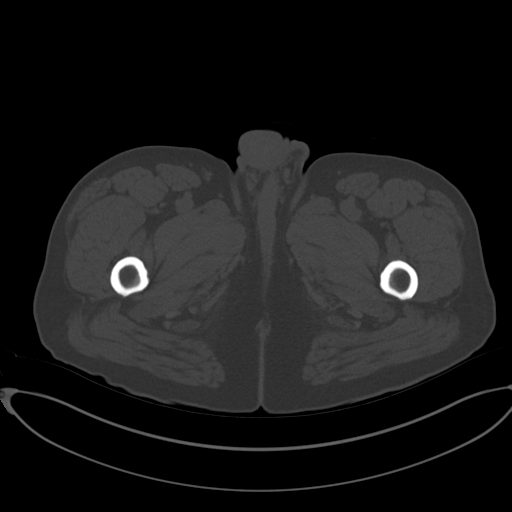
[im 20/99  soft-tissue]
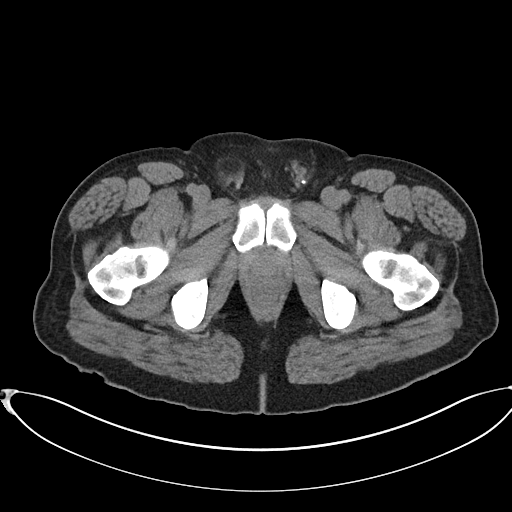
[im 30/99  soft-tissue]
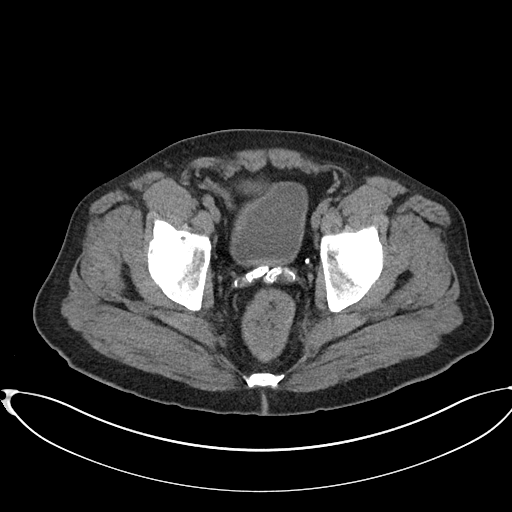
[im 40/99  soft-tissue]
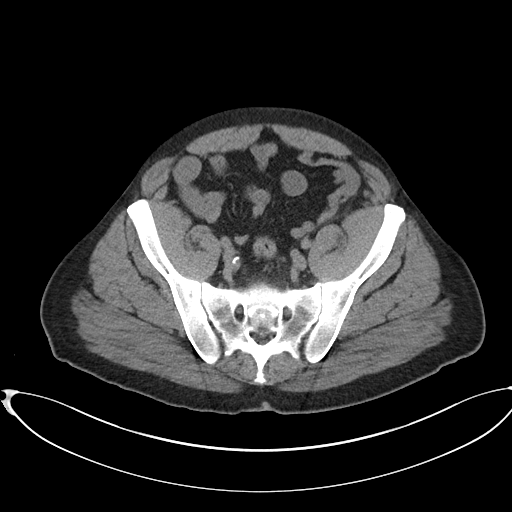
[im 50/99  soft-tissue]
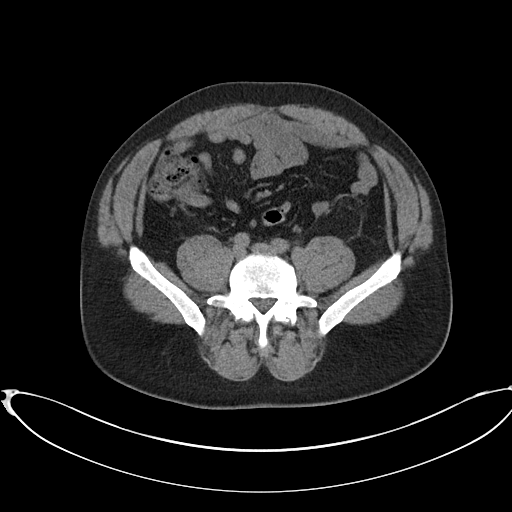
[im 59/99  soft-tissue]
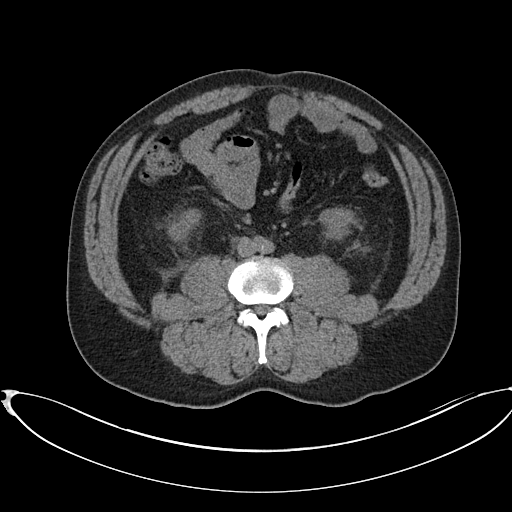
[im 69/99  soft-tissue]
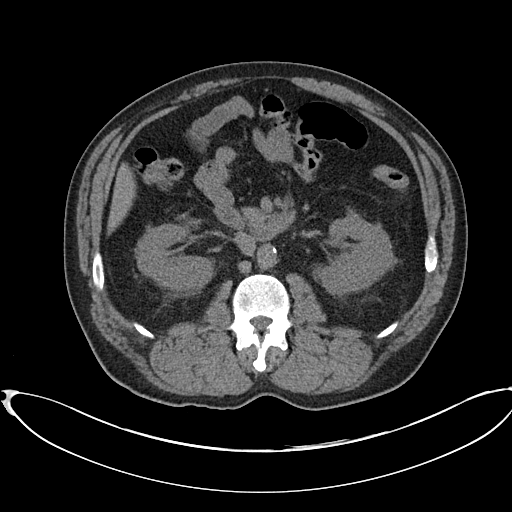
[im 79/99  soft-tissue]
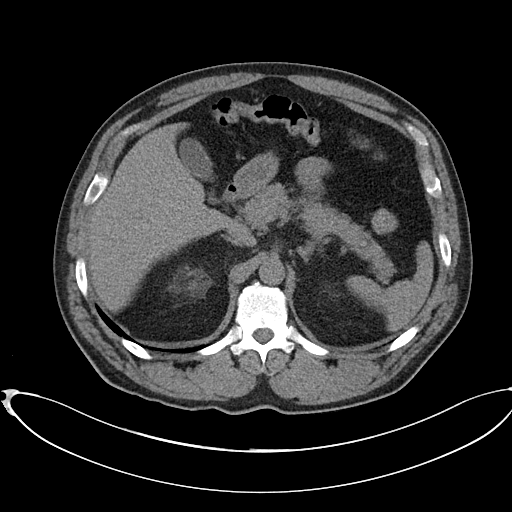
[im 79/99  lung]
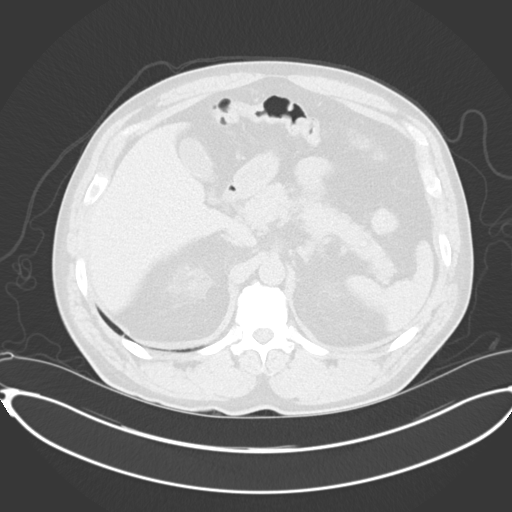
[im 84/99  lung]
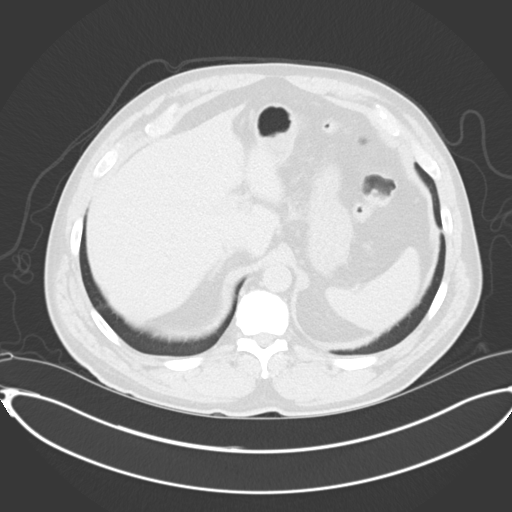
[im 89/99  soft-tissue]
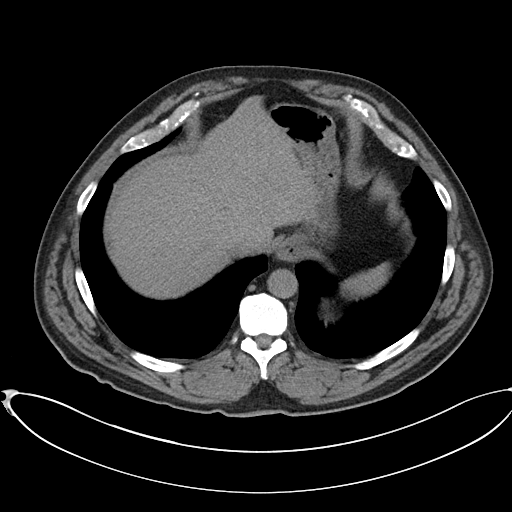
[im 89/99  lung]
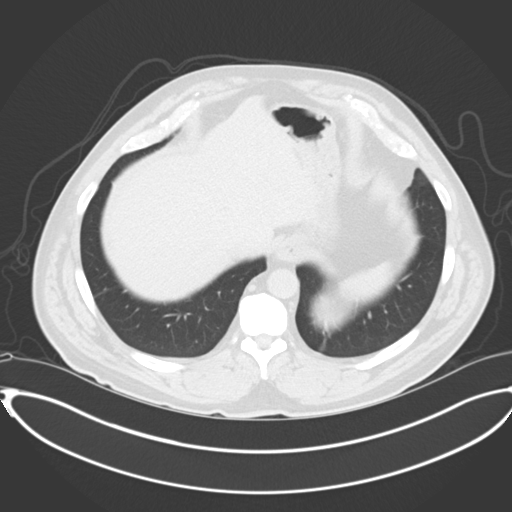
[im 89/99  bone]
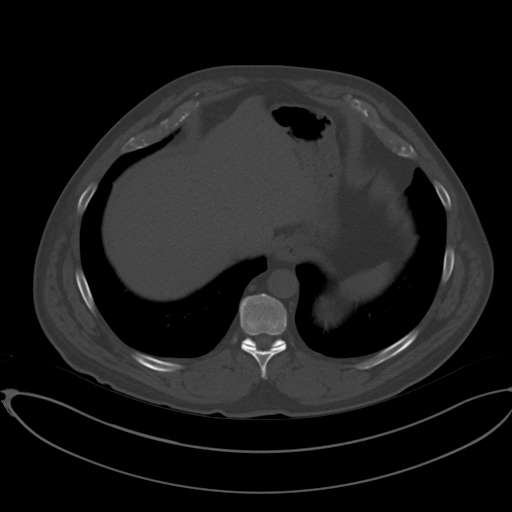
[im 94/99  lung]
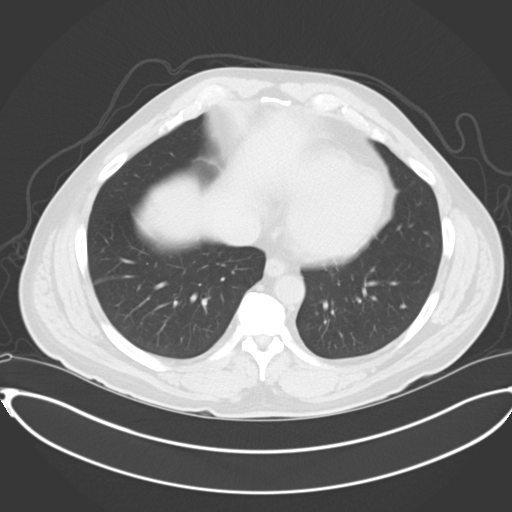

[Series 6: coronal · coronal · 0.83mm/px · 3 of 111 slices shown]
[im 28/111  soft-tissue]
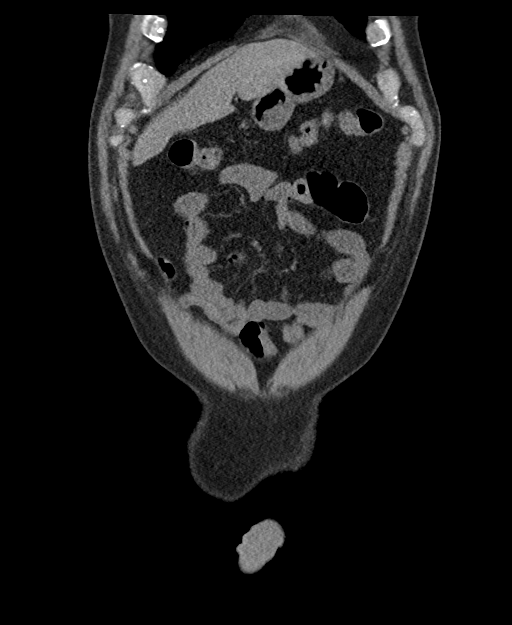
[im 56/111  soft-tissue]
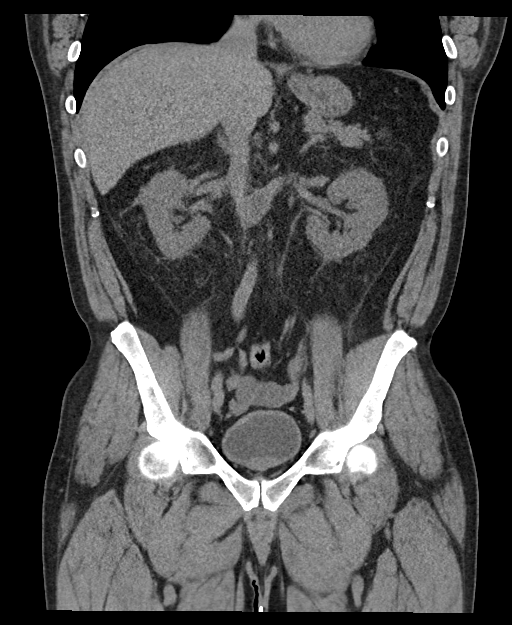
[im 83/111  soft-tissue]
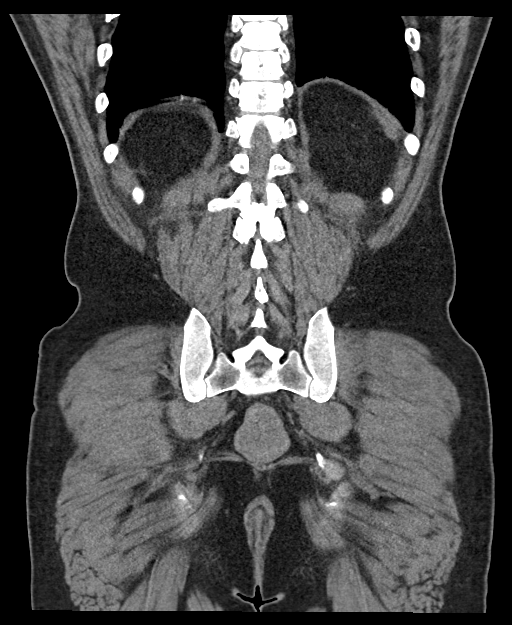

[14 of 46 positions shown; findings below may reference images not displayed]

FINDINGS: Lower chest: Lung bases are clear.

Hepatobiliary: No focal hepatic lesion. No biliary duct dilatation.
Common bile duct is normal.

Pancreas: Pancreas is normal. No ductal dilatation. No pancreatic
inflammation.

Spleen: Normal spleen

Adrenals/urinary tract: Adrenal glands normal. Bilateral perinephric
stranding. No nephrolithiasis or ureterolithiasis. Obstructive
uropathy. No bladder calculi.

Stomach/Bowel: Stomach, small bowel, appendix, and cecum are normal.
The colon and rectosigmoid colon are normal.

Vascular/Lymphatic: Abdominal aorta is normal caliber with
atherosclerotic calcification. There is no retroperitoneal or
periportal lymphadenopathy. No pelvic lymphadenopathy. Is

Reproductive: Prostate normal.  Calcified vas deferens.

Other: Bilateral fat filled inguinal hernias. RIGHT greater than
LEFT.

Musculoskeletal: Is No acute osseous abnormality.
IMPRESSION: 1. No nephrolithiasis, ureterolithiasis, or obstructive uropathy.
2. Bilateral perinephric stranding is nonspecific.
3. No bladder calculi.
4. Follow-up inguinal hernias, RIGHT greater than LEFT.
5. Aortic Atherosclerosis ([id number]-CC0.0).

## 2021-11-24 ENCOUNTER — Ambulatory Visit (HOSPITAL_COMMUNITY)
Admission: EM | Admit: 2021-11-24 | Discharge: 2021-11-24 | Disposition: A | Discharge status: Home / Self Care | Payer: BC Managed Care – PPO | Attending: Physician Assistant | Admitting: Physician Assistant

## 2021-11-24 ENCOUNTER — Encounter (HOSPITAL_COMMUNITY): Payer: Self-pay | Admitting: Emergency Medicine

## 2021-11-24 DIAGNOSIS — M25552 Pain in left hip: Secondary | ICD-10-CM

## 2021-11-24 DIAGNOSIS — M7632 Iliotibial band syndrome, left leg: Secondary | ICD-10-CM

## 2021-11-24 DIAGNOSIS — B356 Tinea cruris: Secondary | ICD-10-CM | POA: Diagnosis not present

## 2021-11-24 MED ORDER — ACETAMINOPHEN ER 650 MG PO TBCR
650.0000 mg | EXTENDED_RELEASE_TABLET | Freq: Three times a day (TID) | ORAL | 0 refills | Status: DC | PRN
Start: 2021-11-24 — End: 2023-03-03

## 2021-11-24 MED ORDER — PREDNISONE 10 MG PO TABS: 10.0000 mg | ORAL_TABLET | Freq: Three times a day (TID) | ORAL | 0 refills | Status: DC

## 2021-11-24 MED ORDER — ECONAZOLE NITRATE 1 % EX CREA: TOPICAL_CREAM | Freq: Two times a day (BID) | CUTANEOUS | 0 refills | Status: DC

## 2021-11-24 NOTE — Discharge Instructions (Addendum)
Advised to use ice therapy, 10 minutes on 20 minutes off, 4-5 times throughout the evening in order to decrease the pain of the easily iliotibial band syndrome disorder. Advised to take the arthritis Tylenol every 8 hours to help relieve pain and discomfort. Advised to take prednisone 10 mg 3 times a day for 5 days only to help reduce the acute inflammatory process. Advised to use the econazole cream every 12 hours and apply to the area to help reduce itching and irritation. Advised follow-up PCP or return to urgent care if symptoms fail to improve.

## 2021-11-24 NOTE — ED Triage Notes (Signed)
Pt reports left hip pain radiating down to left knee. States this has been ongoing for months but normally last about 2 days. States this episode started Saturday and has not resolved. Denies taking any OTC medication.

## 2021-11-24 NOTE — ED Provider Notes (Signed)
[facility]    CSN: [id number] Arrival date & time: 11/24/21  0865      History   Chief Complaint Chief Complaint  Patient presents with   Hip Pain    HPI Corey Mcguire is a 61 y.o. male.   61 year old male presents with left hip pain and jock itch.  Patient indicates for the past 5 days he has been having persistent left hip pain and tenderness which tends to radiate from the lateral area of the hip down to the knee.  Patient indicates that the pain is intermittent but constant and tends to be worse with activity to where he has to move the left leg out, and when he sits down.  Patient indicates that he has not had any trauma to the area, he has not been on any recent long extended car rides.  He does indicate he has had this occur before but it only last for couple days and then resolves.  He denies any numbness or tingling, or weakness.  And he has not taken any OTC medications to help relieve the pain.  Patient does indicate that he is not allowed to take NSAIDs due to renal irritation. Patient relates he has had several weeks duration of jock itch, that he was given some cortisone cream for that helps reduce the itching but he has has not helped resolve the rash.  He relates he still has the rash continue and requests a cream to help relieve the irritation from the area.   Hip Pain    Past Medical History:  Diagnosis Date   Anxiety    History of panic attacks 2016--improved with employment   Hyperlipidemia 10/02/2014   Labs in [facility]:  Total:  330, Trigs:  109, HDL:  89,  LDL:  219.  did not follow up afterward   Hypertension    Libido, decreased 12/26/2018   Prediabetes 10/02/2014   A1C:  5.8%  did not follow up    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   Fatty liver 09/23/2020   Gastroesophageal reflux disease without  esophagitis 09/23/2020   Gout 09/23/2020   Iron deficiency anemia 09/23/2020   Localized, primary osteoarthritis of shoulder region 09/23/2020   Loss of appetite 09/23/2020   Type 2 diabetes mellitus with other specified complication (HCC) 09/23/2020   Shoulder joint pain 03/24/2020   ARF (acute renal failure) (HCC) 02/25/2020   Libido, decreased 12/26/2018   Vitamin D deficiency 11/12/2018   Fatigue 11/12/2018   Low testosterone in male 11/12/2018   Essential hypertension 01/05/2016   Prediabetes 10/02/2014   Hyperlipidemia 10/02/2014    Past Surgical History:  Procedure Laterality Date   KNEE SURGERY     SHOULDER SURGERY         Home Medications    Prior to Admission medications   Medication Sig Start Date End Date Taking? Authorizing Provider  acetaminophen (TYLENOL 8 HOUR ARTHRITIS PAIN) 650 MG CR tablet Take 1 tablet (650 mg total) by mouth every 8 (eight) hours as needed for pain. 11/24/21  Yes Ellsworth Lennox, PA-C  econazole nitrate 1 % cream Apply topically 2 (two) times daily. 11/24/21  Yes Ellsworth Lennox, PA-C  predniSONE (DELTASONE) 10 MG tablet Take 1 tablet (10 mg total) by mouth 3 (three) times daily. 11/24/21  Yes Ellsworth Lennox, PA-C  amLODipine (NORVASC) 10 MG tablet TAKE  ONE TABLET BY MOUTH DAILY Patient taking differently: Take 10 mg by mouth daily. 12/16/16   Julieanne Manson, MD  Cholecalciferol (VITAMIN D3) 50 MCG (2000 UT) capsule Take 4,000 Units by mouth daily.    [provider]  Cholecalciferol (VITAMIN D3) 50 MCG (2000 UT) TABS 1 tablet    [provider]  Cyanocobalamin (VITAMIN B-12) 3000 MCG SUBL Place 3,000 mcg under the tongue daily. 02/27/20   Hongalgi, Maximino Greenland, MD  omeprazole (PRILOSEC) 40 MG capsule Take 40 mg by mouth daily.  10/24/18   [provider]  rosuvastatin (CRESTOR) 20 MG tablet Take 20 mg by mouth daily.  08/18/18   [provider]  valsartan-hydrochlorothiazide (DIOVAN-HCT) 80-12.5 MG tablet Take 1 tablet  by mouth daily. 09/14/20   [provider]    Family History Family History  Problem Relation Age of Onset   Hypertension Mother    Diabetes Father    Heart disease Father        Quadruple bypass    Social History Social History   Tobacco Use   Smoking status: Never   Smokeless tobacco: Current    Types: Chew   Tobacco comments:    Chewed tobacco since age 75 yo  Substance Use Topics   Alcohol use: Yes    Alcohol/week: 10.0 - 14.0 standard drinks of alcohol    Types: 10 - 14 Cans of beer per week   Drug use: No     Allergies   Codeine and Tramadol hcl   Review of Systems Review of Systems  Musculoskeletal:  Positive for gait problem (left hip pain).     Physical Exam Triage Vital Signs ED Triage Vitals  Enc Vitals Group     BP 11/24/21 1054 116/70     Pulse Rate 11/24/21 1054 90     Resp 11/24/21 1054 18     Temp 11/24/21 1054 98.3 F (36.8 C)     Temp Source 11/24/21 1054 Oral     SpO2 11/24/21 1054 100 %     Weight --      Height --      Head Circumference --      Peak Flow --      Pain Score 11/24/21 1052 2     Pain Loc --      Pain Edu? --      Excl. in GC? --    No data found.  Updated Vital Signs BP 116/70 (BP Location: Right Arm)   Pulse 90   Temp 98.3 F (36.8 C) (Oral)   Resp 18   SpO2 100%   Visual Acuity Right Eye Distance:   Left Eye Distance:   Bilateral Distance:    Right Eye Near:   Left Eye Near:    Bilateral Near:     Physical Exam Constitutional:      Appearance: Normal appearance.  Musculoskeletal:       Legs:     Comments: Left hip: Pain is palpated a long the anterior lateral hip area and down the iliotibial band to the knee.  Pain increases with internal and external rotation without crepitus on motion.  Full range of motion is normal, strength is intact.  Pain is reproduced with palpation of the proximal portion of the iliotibial band.  Neurological:     Mental Status: He is alert.      UC  Treatments / Results  Labs (all labs ordered are listed, but only abnormal results are displayed) Labs Reviewed -  No data to display  EKG   Radiology No results found.  Procedures Procedures (including critical care time)  Medications Ordered in UC Medications - No data to display  Initial Impression / Assessment and Plan / UC Course  I have reviewed the triage vital signs and the nursing notes.  Pertinent labs & imaging results that were available during my care of the patient were reviewed by me and considered in my medical decision making (see chart for details).    Plan: 1.  Advised to take Tylenol arthritis formulation every 8 hours to help reduce pain and discomfort. 2.  Advised to use ice therapy, 10 minutes on 20 minutes off, 3-4 times throughout the evening to help reduce pain and irritation. 3.  Advised to take the prednisone 10 mg every 8 hours for 5 days only to help reduce the acute inflammatory process. 4.  Advised to do stretching exercises for hip and iliotibial band after the acute inflammation improves. 5.  Advised to use the econazole cream every 12 hours and apply to the rash area to help improve the tinea cruris. 2.  Advised to follow-up PCP or return to urgent care if symptoms fail to improve. Final Clinical Impressions(s) / UC Diagnoses   Final diagnoses:  Left hip pain  Iliotibial band syndrome of left side  Tinea cruris     Discharge Instructions      Advised to use ice therapy, 10 minutes on 20 minutes off, 4-5 times throughout the evening in order to decrease the pain of the easily iliotibial band syndrome disorder. Advised to take the arthritis Tylenol every 8 hours to help relieve pain and discomfort. Advised to take prednisone 10 mg 3 times a day for 5 days only to help reduce the acute inflammatory process. Advised to use the econazole cream every 12 hours and apply to the area to help reduce itching and irritation. Advised follow-up PCP or  return to urgent care if symptoms fail to improve.    ED Prescriptions     Medication Sig Dispense Auth. Provider   econazole nitrate 1 % cream Apply topically 2 (two) times daily. 15 g Nyoka Lint, PA-C   predniSONE (DELTASONE) 10 MG tablet Take 1 tablet (10 mg total) by mouth 3 (three) times daily. 15 tablet Nyoka Lint, PA-C   acetaminophen (TYLENOL 8 HOUR ARTHRITIS PAIN) 650 MG CR tablet Take 1 tablet (650 mg total) by mouth every 8 (eight) hours as needed for pain. 21 tablet Nyoka Lint, PA-C      PDMP not reviewed this encounter.   Nyoka Lint, PA-C 11/24/21 1119

## 2021-12-15 ENCOUNTER — Ambulatory Visit (INDEPENDENT_AMBULATORY_CARE_PROVIDER_SITE_OTHER): Payer: BC Managed Care – PPO | Admitting: Nurse Practitioner

## 2021-12-15 ENCOUNTER — Encounter: Payer: Self-pay | Admitting: Nurse Practitioner

## 2021-12-15 VITALS — BP 120/78 | HR 83 | Temp 97.1°F | Ht 67.0 in | Wt 220.2 lb

## 2021-12-15 DIAGNOSIS — N1832 Chronic kidney disease, stage 3b: Secondary | ICD-10-CM

## 2021-12-15 DIAGNOSIS — E785 Hyperlipidemia, unspecified: Secondary | ICD-10-CM

## 2021-12-15 DIAGNOSIS — E1169 Type 2 diabetes mellitus with other specified complication: Secondary | ICD-10-CM

## 2021-12-15 DIAGNOSIS — I10 Essential (primary) hypertension: Secondary | ICD-10-CM

## 2021-12-15 DIAGNOSIS — Z86010 Personal history of colonic polyps: Secondary | ICD-10-CM | POA: Insufficient documentation

## 2021-12-15 DIAGNOSIS — Z23 Encounter for immunization: Secondary | ICD-10-CM

## 2021-12-15 DIAGNOSIS — R7989 Other specified abnormal findings of blood chemistry: Secondary | ICD-10-CM

## 2021-12-15 DIAGNOSIS — E876 Hypokalemia: Secondary | ICD-10-CM

## 2021-12-15 DIAGNOSIS — K4090 Unilateral inguinal hernia, without obstruction or gangrene, not specified as recurrent: Secondary | ICD-10-CM | POA: Insufficient documentation

## 2021-12-15 DIAGNOSIS — I700 Atherosclerosis of aorta: Secondary | ICD-10-CM | POA: Insufficient documentation

## 2021-12-15 DIAGNOSIS — T502X5A Adverse effect of carbonic-anhydrase inhibitors, benzothiadiazides and other diuretics, initial encounter: Secondary | ICD-10-CM

## 2021-12-15 LAB — HEMOGLOBIN A1C: Hgb A1c MFr Bld: 6.4 % (ref 4.6–6.5)

## 2021-12-15 LAB — MICROALBUMIN / CREATININE URINE RATIO
Creatinine,U: 196.8 mg/dL
Microalb Creat Ratio: 21.2 mg/g (ref 0.0–30.0)
Microalb, Ur: 41.8 mg/dL — ABNORMAL HIGH (ref 0.0–1.9)

## 2021-12-15 LAB — LDL CHOLESTEROL, DIRECT: Direct LDL: 61 mg/dL

## 2021-12-15 MED ORDER — VALSARTAN-HYDROCHLOROTHIAZIDE 80-12.5 MG PO TABS: 1.0000 | ORAL_TABLET | Freq: Every day | ORAL | 0 refills | Status: DC

## 2021-12-15 MED ORDER — AMLODIPINE BESYLATE 10 MG PO TABS: 10.0000 mg | ORAL_TABLET | Freq: Every day | ORAL | 1 refills | Status: DC

## 2021-12-15 MED ORDER — ROSUVASTATIN CALCIUM 20 MG PO TABS: 20.0000 mg | ORAL_TABLET | Freq: Every day | ORAL | 1 refills | Status: DC

## 2021-12-15 NOTE — Assessment & Plan Note (Signed)
BP at goal with amlodipine and valsartan/hctz Report ankle swelling with prolonged standing/walking/sitting. He takes amlodipine in AM. BP Readings from Last 3 Encounters:  12/15/21 120/78  11/24/21 116/70  07/07/20 (!) 152/89   Maintain med dose, refill sent Check CMP. With hx of CKD-3, will make sure we do not need to remove HCTZ.

## 2021-12-15 NOTE — Assessment & Plan Note (Signed)
Current use of crestor without side effects Repeat direct LDL and CMP Maintain med dose

## 2021-12-15 NOTE — Progress Notes (Signed)
Established Patient Visit  Patient: Corey Mcguire   DOB: 05-15-1960   61 y.o. Male  MRN: [id number] Visit Date: 12/15/2021  Subjective:    Chief Complaint  Patient presents with   Establish Care    New Pt, est care Med refill No other concerns  Flu & shingles today  Requesting records for Colonoscopy   Previous pcp: Dr. Rockwell Germany [facility], last appt 03/2021  HPI Essential hypertension BP at goal with amlodipine and valsartan/hctz Report ankle swelling with prolonged standing/walking/sitting. He takes amlodipine in AM. BP Readings from Last 3 Encounters:  12/15/21 120/78  11/24/21 116/70  07/07/20 (!) 152/89   Maintain med dose, refill sent Check CMP. With hx of CKD-3, will make sure we do not need to remove HCTZ.  Hyperlipidemia associated with type 2 diabetes mellitus ([place]) Current use of crestor without side effects Repeat direct LDL and CMP Maintain med dose  Type 2 diabetes mellitus with other specified complication (HCC) Random glucose at home: 80-90 No medication at this time Repeat hgbA1c, urine microalbumin, LDL and CMP  Reviewed medical, surgical, and social history today  Medications: Outpatient Medications Prior to Visit  Medication Sig   acetaminophen (TYLENOL 8 HOUR ARTHRITIS PAIN) 650 MG CR tablet Take 1 tablet (650 mg total) by mouth every 8 (eight) hours as needed for pain.   Cholecalciferol (VITAMIN D3) 50 MCG (2000 UT) TABS 1 tablet   omeprazole (PRILOSEC) 40 MG capsule Take 40 mg by mouth daily.    [DISCONTINUED] amLODipine (NORVASC) 10 MG tablet TAKE ONE TABLET BY MOUTH DAILY (Patient taking differently: Take 10 mg by mouth daily.)   [DISCONTINUED] Cyanocobalamin (VITAMIN B-12) 3000 MCG SUBL Place 3,000 mcg under the tongue daily.   [DISCONTINUED] rosuvastatin (CRESTOR) 20 MG tablet Take 20 mg by mouth daily.    [DISCONTINUED] valsartan-hydrochlorothiazide (DIOVAN-HCT) 80-12.5 MG tablet Take 1 tablet by mouth daily.    [DISCONTINUED] Cholecalciferol (VITAMIN D3) 50 MCG (2000 UT) capsule Take 4,000 Units by mouth daily. (Patient not taking: Reported on 12/15/2021)   [DISCONTINUED] econazole nitrate 1 % cream Apply topically 2 (two) times daily. (Patient not taking: Reported on 12/15/2021)   [DISCONTINUED] predniSONE (DELTASONE) 10 MG tablet Take 1 tablet (10 mg total) by mouth 3 (three) times daily. (Patient not taking: Reported on 12/15/2021)   No facility-administered medications prior to visit.   Reviewed past medical and social history.   ROS per HPI above      Objective:  BP 120/78 (BP Location: Right Arm, Patient Position: Sitting, Cuff Size: Normal)   Pulse 83   Temp (!) 97.1 F (36.2 C) (Temporal)   Ht _0  (1.702 m)   Wt 220 lb 3.2 oz (99.9 kg)   SpO2 98%   BMI 34.49 kg/m      Physical Exam Cardiovascular:     Rate and Rhythm: Normal rate and regular rhythm.     Pulses: Normal pulses.     Heart sounds: Normal heart sounds.  Pulmonary:     Effort: Pulmonary effort is normal.     Breath sounds: Normal breath sounds.  Musculoskeletal:     Right lower leg: No edema.     Left lower leg: No edema.  Neurological:     Mental Status: He is alert and oriented to person, place, and time.     No results found for any visits on 12/15/21.    Assessment & Plan:  Problem List Items Addressed This Visit       Cardiovascular and Mediastinum   Essential hypertension    BP at goal with amlodipine and valsartan/hctz Report ankle swelling with prolonged standing/walking/sitting. He takes amlodipine in AM. BP Readings from Last 3 Encounters:  12/15/21 120/78  11/24/21 116/70  07/07/20 (!) 152/89   Maintain med dose, refill sent Check CMP. With hx of CKD-3, will make sure we do not need to remove HCTZ.      Relevant Medications   amLODipine (NORVASC) 10 MG tablet   valsartan-hydrochlorothiazide (DIOVAN-HCT) 80-12.5 MG tablet   rosuvastatin (CRESTOR) 20 MG tablet   Other Relevant  Orders   Comprehensive metabolic panel with eGFR ([facility])     Endocrine   Hyperlipidemia associated with type 2 diabetes mellitus ([place])    Current use of crestor without side effects Repeat direct LDL and CMP Maintain med dose      Relevant Medications   amLODipine (NORVASC) 10 MG tablet   valsartan-hydrochlorothiazide (DIOVAN-HCT) 80-12.5 MG tablet   rosuvastatin (CRESTOR) 20 MG tablet   Other Relevant Orders   Direct LDL   Type 2 diabetes mellitus with other specified complication ([place]) - Primary    Random glucose at home: 80-90 No medication at this time Repeat hgbA1c, urine microalbumin, LDL and CMP      Relevant Medications   valsartan-hydrochlorothiazide (DIOVAN-HCT) 80-12.5 MG tablet   rosuvastatin (CRESTOR) 20 MG tablet   Other Relevant Orders   Urine microalbumin-creatinine with uACR   Comprehensive metabolic panel with eGFR ([facility])   Hemoglobin A1c     Genitourinary   Chronic kidney disease, stage 3b (HCC)   Relevant Orders   Comprehensive metabolic panel with eGFR ([facility])   Other Visit Diagnoses     Need for immunization against influenza       Relevant Orders   Flu Vaccine QUAD High Dose(Fluad) (Completed)      Return in about 4 weeks (around 01/12/2022) for insomnia and ED.     Wilfred Lacy, NP

## 2021-12-15 NOTE — Assessment & Plan Note (Signed)
Random glucose at home: 80-90 No medication at this time Repeat hgbA1c, urine microalbumin, LDL and CMP

## 2021-12-15 NOTE — Patient Instructions (Signed)
Thank you for choosing [facility]  Go to lab for blood draw and urine collection  Sign medical release form to get your records from previous providers.

## 2021-12-16 ENCOUNTER — Telehealth: Payer: Self-pay

## 2021-12-16 LAB — COMPLETE METABOLIC PANEL WITH GFR
AG Ratio: 1.1 (calc) (ref 1.0–2.5)
ALT: 69 U/L — ABNORMAL HIGH (ref 9–46)
AST: 62 U/L — ABNORMAL HIGH (ref 10–35)
Albumin: 3.9 g/dL (ref 3.6–5.1)
Alkaline phosphatase (APISO): 100 U/L (ref 35–144)
BUN/Creatinine Ratio: 8 (calc) (ref 6–22)
BUN: 13 mg/dL (ref 7–25)
CO2: 26 mmol/L (ref 20–32)
Calcium: 8.7 mg/dL (ref 8.6–10.3)
Chloride: 99 mmol/L (ref 98–110)
Creat: 1.56 mg/dL — ABNORMAL HIGH (ref 0.70–1.30)
Globulin: 3.6 g/dL (calc) (ref 1.9–3.7)
Glucose, Bld: 140 mg/dL — ABNORMAL HIGH (ref 65–99)
Potassium: 2.7 mmol/L — CL (ref 3.5–5.3)
Sodium: 137 mmol/L (ref 135–146)
Total Bilirubin: 0.8 mg/dL (ref 0.2–1.2)
Total Protein: 7.5 g/dL (ref 6.1–8.1)
eGFR: 51 mL/min/{1.73_m2} — ABNORMAL LOW (ref >=60)

## 2021-12-16 MED ORDER — POTASSIUM CHLORIDE CRYS ER 20 MEQ PO TBCR: 20.0000 meq | EXTENDED_RELEASE_TABLET | Freq: Two times a day (BID) | ORAL | 0 refills | Status: DC

## 2021-12-16 MED ORDER — VALSARTAN 80 MG PO TABS: 80.0000 mg | ORAL_TABLET | Freq: Every day | ORAL | 3 refills | Status: DC

## 2021-12-16 NOTE — Telephone Encounter (Signed)
[facility]  Caller: Vaughan Basta at [employer] [employer]   Receiver: [place] L. CMA/CPT   Time: 8:55 am  Critical Value: Potassium 2.7    Please advise

## 2021-12-16 NOTE — Addendum Note (Signed)
Addended by: Wilfred Lacy L on: 12/16/2021 10:09 AM   Modules accepted: Orders

## 2022-01-10 ENCOUNTER — Telehealth: Payer: Self-pay | Admitting: Nurse Practitioner

## 2022-01-10 ENCOUNTER — Ambulatory Visit: Payer: BC Managed Care – PPO | Admitting: Nurse Practitioner

## 2022-01-10 NOTE — Telephone Encounter (Signed)
Patient/Caregiver was notified of No Show/Late Cancellation Policy & possible $70 charge. Visit was cancelled with reason "No Show/Cancel within 24 hours" for tracking & charging.  Caller Name: Corbett Moulder Ph #: [phone] Date of APPT: 01/10/2022 Reason given for no show/late cancellation: has a work meeting and cannot miss it No Show Letter printed & put in outgoing mail (Yes/No): yes   1st no show, fee waived

## 2022-01-28 ENCOUNTER — Encounter: Payer: Self-pay | Admitting: Nurse Practitioner

## 2022-01-28 ENCOUNTER — Ambulatory Visit: Payer: BC Managed Care – PPO | Admitting: Nurse Practitioner

## 2022-01-28 VITALS — BP 130/80 | HR 88 | Temp 97.9°F | Ht 67.0 in | Wt 222.0 lb

## 2022-01-28 DIAGNOSIS — K2100 Gastro-esophageal reflux disease with esophagitis, without bleeding: Secondary | ICD-10-CM

## 2022-01-28 DIAGNOSIS — R7401 Elevation of levels of liver transaminase levels: Secondary | ICD-10-CM

## 2022-01-28 DIAGNOSIS — Z720 Tobacco use: Secondary | ICD-10-CM | POA: Insufficient documentation

## 2022-01-28 DIAGNOSIS — I10 Essential (primary) hypertension: Secondary | ICD-10-CM | POA: Diagnosis not present

## 2022-01-28 DIAGNOSIS — I700 Atherosclerosis of aorta: Secondary | ICD-10-CM | POA: Diagnosis not present

## 2022-01-28 DIAGNOSIS — K760 Fatty (change of) liver, not elsewhere classified: Secondary | ICD-10-CM

## 2022-01-28 DIAGNOSIS — F5101 Primary insomnia: Secondary | ICD-10-CM

## 2022-01-28 DIAGNOSIS — N521 Erectile dysfunction due to diseases classified elsewhere: Secondary | ICD-10-CM

## 2022-01-28 DIAGNOSIS — N1832 Chronic kidney disease, stage 3b: Secondary | ICD-10-CM

## 2022-01-28 MED ORDER — ZALEPLON 5 MG PO CAPS: 5.0000 mg | ORAL_CAPSULE | Freq: Every evening | ORAL | 5 refills | Status: DC | PRN

## 2022-01-28 MED ORDER — FAMOTIDINE 20 MG PO TABS: 20.0000 mg | ORAL_TABLET | Freq: Every day | ORAL | 1 refills | Status: DC

## 2022-01-28 MED ORDER — OMEPRAZOLE 20 MG PO CPDR: 20.0000 mg | DELAYED_RELEASE_CAPSULE | Freq: Every morning | ORAL | 1 refills | Status: DC

## 2022-01-28 MED ORDER — SILDENAFIL CITRATE 50 MG PO TABS: 50.0000 mg | ORAL_TABLET | Freq: Every day | ORAL | 1 refills | Status: DC | PRN

## 2022-01-28 NOTE — Patient Instructions (Addendum)
Schedule appt for eye exam Stop alcohol and tobacco use. Use sonata for sleep Use omeprazole in AM and pepcid in PM for GERD. Maintain other med doses Go to lab

## 2022-01-28 NOTE — Progress Notes (Signed)
Established Patient Visit  Patient: Corey Mcguire   DOB: [birth year]   61 y.o. Male  MRN: [id number] Visit Date: 01/28/2022  Subjective:    Chief Complaint  Patient presents with   Office Visit    Insomnia/ ED  Refill on prilosec  No other concerns    HPI Smokeless tobacco use Onset at age 40, uses 1can in 2days, never quit in past, has no desire to quit. States he is aware of his risk for cancer, CAD, PAD, uncontrolled htn and GERD exacerbation. Encouraged to quit use  Erectile dysfunction Onset 19yrs ago, has weak erection and unable to, no AM erection Testosterone, LH, and prolactin level checked 2020: normal Previous use of viagra with significant improvement.  Advised about correlation to microvascular disease, hence the importance of BP and DM control. Need to quit tobacco use. Viagra 50mg  sent  Chronic kidney disease, stage 3b (HCC) Repeat bmp  GERD with esophagitis Heartburn and dysphagia with omeprazole. No melena or hemarochezia or weight loss. ETOH use: 2shots of liquor 4x/week Smokeless tobacco use. Chronic use of omeprazole 40mg  No improvement with omeprazole 20mg . Never used pepcid  Sent omeprazole 20mg  in AM and pepcid 20mg  in PM Encourage to quit tobacco and ETOH use. Consider GI referral to EGD if no improvement.  Essential hypertension BP controlled with valsartan Home BP 127/86. BP Readings from Last 3 Encounters:  01/28/22 130/80  12/15/21 120/78  11/24/21 116/70    Repeat BMP due to hypokalemia Maintain valsartan dose  Fatty liver Elevated AST and ALT CT ABD 02/2020: No focal hepatic lesion. No biliary duct dilatation. Common bile duct is normal. Advised to quit ETOH use and maintain DASH diet Repeat hepatic panel today  Primary insomnia Chronic, difficulty staying asleep, Bedtime between 10-12pm, wakes up after 4-5hrs of sleep and unabe to return to sleep. Previous use of ambien-unable to tolerate (talking in sleep)  and Use of klonopin with good results. Tried OTC sleep aid-no improvement Hx of snoring per wife, no daytime somnolence, no AM headache.  Advised to quit ETOh use Sent sonata  Reviewed medical, surgical, and social history today  Medications: Outpatient Medications Prior to Visit  Medication Sig   acetaminophen (TYLENOL 8 HOUR ARTHRITIS PAIN) 650 MG CR tablet Take 1 tablet (650 mg total) by mouth every 8 (eight) hours as needed for pain.   amLODipine (NORVASC) 10 MG tablet Take 1 tablet (10 mg total) by mouth at bedtime.   potassium chloride SA (KLOR-CON M) 20 MEQ tablet Take 1 tablet (20 mEq total) by mouth 2 (two) times daily.   rosuvastatin (CRESTOR) 20 MG tablet Take 1 tablet (20 mg total) by mouth daily.   valsartan (DIOVAN) 80 MG tablet Take 1 tablet (80 mg total) by mouth daily.   Cholecalciferol (VITAMIN D3) 50 MCG (2000 UT) TABS 1 tablet (Patient not taking: Reported on 01/28/2022)   [DISCONTINUED] omeprazole (PRILOSEC) 40 MG capsule Take 40 mg by mouth daily.  (Patient not taking: Reported on 01/28/2022)   No facility-administered medications prior to visit.   Reviewed past medical and social history.   ROS per HPI above      Objective:  BP 130/80   Pulse 88   Temp 97.9 F (36.6 C) (Temporal)   Ht 5\' 7"  (1.702 m)   Wt 222 lb (100.7 kg)   SpO2 98%   BMI 34.77 kg/m      Physical Exam  No results found for any visits on 01/28/22.    Assessment & Plan:    Problem List Items Addressed This Visit       Cardiovascular and Mediastinum   Aortic atherosclerosis (HCC)   Relevant Medications   sildenafil (VIAGRA) 50 MG tablet   Essential hypertension - Primary    BP controlled with valsartan Home BP 127/86. BP Readings from Last 3 Encounters:  01/28/22 130/80  12/15/21 120/78  11/24/21 116/70    Repeat BMP due to hypokalemia Maintain valsartan dose      Relevant Medications   sildenafil (VIAGRA) 50 MG tablet   Other Relevant Orders   Basic metabolic  panel     Digestive   Fatty liver    Elevated AST and ALT CT ABD 02/2020: No focal hepatic lesion. No biliary duct dilatation. Common bile duct is normal. Advised to quit ETOH use and maintain DASH diet Repeat hepatic panel today      GERD with esophagitis    Heartburn and dysphagia with omeprazole. No melena or hemarochezia or weight loss. ETOH use: 2shots of liquor 4x/week Smokeless tobacco use. Chronic use of omeprazole 40mg  No improvement with omeprazole 20mg . Never used pepcid  Sent omeprazole 20mg  in AM and pepcid 20mg  in PM Encourage to quit tobacco and ETOH use. Consider GI referral to EGD if no improvement.      Relevant Medications   omeprazole (PRILOSEC) 20 MG capsule   famotidine (PEPCID) 20 MG tablet     Genitourinary   Chronic kidney disease, stage 3b (HCC)    Repeat bmp      Relevant Orders   Basic metabolic panel     Other   Erectile dysfunction    Onset 67yrs ago, has weak erection and unable to, no AM erection Testosterone, LH, and prolactin level checked 2020: normal Previous use of viagra with significant improvement.  Advised about correlation to microvascular disease, hence the importance of BP and DM control. Need to quit tobacco use. Viagra 50mg  sent      Relevant Medications   sildenafil (VIAGRA) 50 MG tablet   Primary insomnia    Chronic, difficulty staying asleep, Bedtime between 10-12pm, wakes up after 4-5hrs of sleep and unabe to return to sleep. Previous use of ambien-unable to tolerate (talking in sleep) and Use of klonopin with good results. Tried OTC sleep aid-no improvement Hx of snoring per wife, no daytime somnolence, no AM headache.  Advised to quit ETOh use Sent sonata      Relevant Medications   zaleplon (SONATA) 5 MG capsule   Smokeless tobacco use    Onset at age 13, uses 1can in 2days, never quit in past, has no desire to quit. States he is aware of his risk for cancer, CAD, PAD, uncontrolled htn and GERD  exacerbation. Encouraged to quit use      Other Visit Diagnoses     Elevated transaminase level       Relevant Orders   Hepatic function panel      Return in about 3 months (around 04/30/2022) for DM, HTN, hyperlipidemia (fasting).     , NP

## 2022-01-28 NOTE — Assessment & Plan Note (Signed)
Repeat bmp

## 2022-01-28 NOTE — Assessment & Plan Note (Signed)
Chronic, difficulty staying asleep, Bedtime between 10-12pm, wakes up after 4-5hrs of sleep and unabe to return to sleep. Previous use of ambien-unable to tolerate (talking in sleep) and Use of klonopin with good results. Tried OTC sleep aid-no improvement Hx of snoring per wife, no daytime somnolence, no AM headache.  Advised to quit ETOh use Sent sonata

## 2022-01-28 NOTE — Assessment & Plan Note (Addendum)
Heartburn and dysphagia with omeprazole. No melena or hemarochezia or weight loss. ETOH use: 2shots of liquor 4x/week Smokeless tobacco use. Chronic use of omeprazole 40mg  No improvement with omeprazole 20mg . Never used pepcid  Sent omeprazole 20mg  in AM and pepcid 20mg  in PM Encourage to quit tobacco and ETOH use. Consider GI referral to EGD if no improvement.

## 2022-01-28 NOTE — Assessment & Plan Note (Signed)
Onset 69yrs ago, has weak erection and unable to, no AM erection Testosterone, LH, and prolactin level checked 2020: normal Previous use of viagra with significant improvement.  Advised about correlation to microvascular disease, hence the importance of BP and DM control. Need to quit tobacco use. Viagra 50mg  sent

## 2022-01-28 NOTE — Assessment & Plan Note (Addendum)
Elevated AST and ALT CT ABD 02/2020: No focal hepatic lesion. No biliary duct dilatation. Common bile duct is normal. Advised to quit ETOH use and maintain DASH diet Repeat hepatic panel today

## 2022-01-28 NOTE — Assessment & Plan Note (Signed)
Onset at age 61, uses 1can in 2days, never quit in past, has no desire to quit. States he is aware of his risk for cancer, CAD, PAD, uncontrolled htn and GERD exacerbation. Encouraged to quit use

## 2022-01-28 NOTE — Assessment & Plan Note (Addendum)
BP controlled with valsartan Home BP 127/86. BP Readings from Last 3 Encounters:  01/28/22 130/80  12/15/21 120/78  11/24/21 116/70    Repeat BMP due to hypokalemia Maintain valsartan dose

## 2022-01-29 LAB — BASIC METABOLIC PANEL
BUN/Creatinine Ratio: 5 — ABNORMAL LOW (ref 9–20)
BUN: 6 mg/dL (ref 6–24)
CO2: 23 mmol/L (ref 20–29)
Calcium: 9.5 mg/dL (ref 8.7–10.2)
Chloride: 98 mmol/L (ref 96–106)
Creatinine, Ser: 1.25 mg/dL (ref 0.76–1.27)
Glucose: 192 mg/dL — ABNORMAL HIGH (ref 70–99)
Potassium: 4.1 mmol/L (ref 3.5–5.2)
Sodium: 135 mmol/L (ref 134–144)
eGFR: 67 mL/min/{1.73_m2} (ref >59)

## 2022-01-29 LAB — HEPATIC FUNCTION PANEL
ALT: 261 IU/L — ABNORMAL HIGH (ref 0–44)
AST: 186 IU/L — ABNORMAL HIGH (ref 0–40)
Albumin: 4.8 g/dL (ref 3.8–4.9)
Alkaline Phosphatase: 243 IU/L — ABNORMAL HIGH (ref 44–121)
Bilirubin Total: 0.8 mg/dL (ref 0.0–1.2)
Bilirubin, Direct: 0.35 mg/dL (ref 0.00–0.40)
Total Protein: 7.8 g/dL (ref 6.0–8.5)

## 2022-01-31 NOTE — Addendum Note (Signed)
Addended by: Michaela Corner on: 01/31/2022 03:33 PM   Modules accepted: Orders

## 2022-03-17 ENCOUNTER — Ambulatory Visit: Payer: BC Managed Care – PPO | Admitting: Nurse Practitioner

## 2022-03-17 ENCOUNTER — Encounter: Payer: Self-pay | Admitting: Nurse Practitioner

## 2022-03-17 VITALS — BP 122/78 | HR 95 | Temp 98.1°F | Ht 67.0 in | Wt 215.4 lb

## 2022-03-17 DIAGNOSIS — J0110 Acute frontal sinusitis, unspecified: Secondary | ICD-10-CM

## 2022-03-17 DIAGNOSIS — R519 Headache, unspecified: Secondary | ICD-10-CM | POA: Diagnosis not present

## 2022-03-17 LAB — POC COVID19 BINAXNOW: SARS Coronavirus 2 Ag: NEGATIVE

## 2022-03-17 LAB — POCT INFLUENZA A/B
Influenza A, POC: NEGATIVE
Influenza B, POC: NEGATIVE

## 2022-03-17 MED ORDER — AMOXICILLIN-POT CLAVULANATE 875-125 MG PO TABS: 1.0000 | ORAL_TABLET | Freq: Two times a day (BID) | ORAL | 0 refills | Status: DC

## 2022-03-17 MED ORDER — KETOROLAC TROMETHAMINE 60 MG/2ML IM SOLN
30.0000 mg | Freq: Once | INTRAMUSCULAR | Status: AC
  Administered 2022-03-17: 30 mg via INTRAMUSCULAR

## 2022-03-17 NOTE — Patient Instructions (Signed)
It was great to see you!  Start augmentin twice a day for 10 days with food.   Continue over the counter sinus medication. You can take hot showers with steam.   We are giving you a toradol shot today to help with your headache.   Let's follow-up if your symptoms worsen or don't improve.   Take care,  Rodman Pickle, NP

## 2022-03-17 NOTE — Progress Notes (Signed)
Acute Office Visit  Subjective:     Patient ID: Corey Mcguire, male    DOB: 06-Sep-1960, 61 y.o.   MRN: [id number]  Chief Complaint  Patient presents with   Acute Visit    Nasal congestion , headache x 4 days    HPI Patient is in today for nasal congestion for about 10 days and headache for 4 days.   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     Objective:    BP 122/78 (BP Location: Right Arm, Cuff Size: Large)   Pulse 95   Temp 98.1 F (36.7 C) (Temporal)   Ht 5\' 7"  (1.702 m)   Wt 215 lb 6.4 oz (97.7 kg)   SpO2 99%   BMI 33.74 kg/m     Physical Exam Vitals and nursing note reviewed.  Constitutional:      Appearance: Normal appearance.  HENT:     Head: Normocephalic.     Right Ear: Tympanic membrane, ear canal and external ear normal.     Left Ear: Tympanic membrane, ear canal and external ear normal.     Nose:     Right Sinus: Maxillary sinus tenderness and frontal sinus tenderness present.     Left Sinus: Maxillary sinus tenderness and frontal sinus tenderness present.  Eyes:     Conjunctiva/sclera: Conjunctivae normal.  Cardiovascular:     Rate and Rhythm: Normal rate and regular rhythm.     Pulses:  Normal pulses.     Heart sounds: Normal heart sounds.  Pulmonary:     Effort: Pulmonary effort is normal.     Breath sounds: Normal breath sounds.  Musculoskeletal:     Cervical back: Normal range of motion and neck supple. No tenderness.  Lymphadenopathy:     Cervical: No cervical adenopathy.  Skin:    General: Skin is warm.  Neurological:     General: No focal deficit present.     Mental Status: He is alert and oriented to person, place, and time.  Psychiatric:        Mood and Affect: Mood normal.        Behavior: Behavior normal.        Thought Content: Thought content normal.        Judgment: Judgment normal.     Results for orders placed or performed in visit on 03/17/22  POC COVID-19 BinaxNow  Result Value Ref Range   SARS Coronavirus 2 Ag Negative Negative  POCT Influenza A/B  Result Value Ref Range   Influenza A, POC Negative Negative   Influenza B, POC Negative Negative        Assessment & Plan:   Problem List Items Addressed This Visit   None Visit Diagnoses     Acute non-recurrent frontal sinusitis    -  Primary  Will treat with augmentin BID x10 days. Continue OTC sinus medication. Limit tylenol due to elevated LFTs. Encourage fluids, rest.   Relevant Medications   amoxicillin-clavulanate (AUGMENTIN) 875-125 MG tablet   Sinus headache       POC flu and covid negative. Will give toradol 30mg  IM in office with acute headache. He can take hot showers/use saline nasal rinse to help with pain.   Relevant Medications   ketorolac (TORADOL) injection 30 mg (Completed)   Other Relevant Orders   POC COVID-19 BinaxNow (Completed)   POCT Influenza A/B (Completed)       Meds ordered this encounter  Medications   amoxicillin-clavulanate (AUGMENTIN) 875-125 MG tablet    Sig: Take 1 tablet by mouth 2 (two) times daily.    Dispense:  20 tablet    Refill:  0   ketorolac (TORADOL) injection 30 mg    Return if symptoms worsen or fail to improve.  , NP

## 2022-03-18 ENCOUNTER — Other Ambulatory Visit: Payer: Self-pay | Admitting: Nurse Practitioner

## 2022-03-18 DIAGNOSIS — N521 Erectile dysfunction due to diseases classified elsewhere: Secondary | ICD-10-CM

## 2022-05-01 ENCOUNTER — Other Ambulatory Visit: Payer: Self-pay | Admitting: Nurse Practitioner

## 2022-05-01 DIAGNOSIS — N521 Erectile dysfunction due to diseases classified elsewhere: Secondary | ICD-10-CM

## 2022-05-02 ENCOUNTER — Ambulatory Visit: Payer: BC Managed Care – PPO | Admitting: Nurse Practitioner

## 2022-05-02 ENCOUNTER — Telehealth: Payer: Self-pay | Admitting: Nurse Practitioner

## 2022-05-02 NOTE — Telephone Encounter (Signed)
2nd no show/same day cancel per appt notes, no reason given, fee generated, final warning sent via mail

## 2022-05-02 NOTE — Telephone Encounter (Signed)
Pt was a no show for an OV with [facility] on 05/02/22, I sent a letter.

## 2022-06-11 ENCOUNTER — Other Ambulatory Visit: Payer: Self-pay | Admitting: Nurse Practitioner

## 2022-06-11 DIAGNOSIS — I10 Essential (primary) hypertension: Secondary | ICD-10-CM

## 2022-06-11 DIAGNOSIS — E785 Hyperlipidemia, unspecified: Secondary | ICD-10-CM

## 2022-06-15 ENCOUNTER — Encounter: Payer: Self-pay | Admitting: Nurse Practitioner

## 2022-06-15 ENCOUNTER — Ambulatory Visit: Payer: BC Managed Care – PPO | Admitting: Nurse Practitioner

## 2022-06-15 VITALS — BP 120/84 | HR 83 | Temp 98.7°F | Resp 16 | Ht 67.0 in | Wt 216.6 lb

## 2022-06-15 DIAGNOSIS — I10 Essential (primary) hypertension: Secondary | ICD-10-CM

## 2022-06-15 DIAGNOSIS — I700 Atherosclerosis of aorta: Secondary | ICD-10-CM

## 2022-06-15 DIAGNOSIS — N1832 Chronic kidney disease, stage 3b: Secondary | ICD-10-CM | POA: Diagnosis not present

## 2022-06-15 DIAGNOSIS — E785 Hyperlipidemia, unspecified: Secondary | ICD-10-CM

## 2022-06-15 DIAGNOSIS — E1169 Type 2 diabetes mellitus with other specified complication: Secondary | ICD-10-CM

## 2022-06-15 DIAGNOSIS — K709 Alcoholic liver disease, unspecified: Secondary | ICD-10-CM

## 2022-06-15 DIAGNOSIS — E559 Vitamin D deficiency, unspecified: Secondary | ICD-10-CM | POA: Diagnosis not present

## 2022-06-15 DIAGNOSIS — R7401 Elevation of levels of liver transaminase levels: Secondary | ICD-10-CM | POA: Insufficient documentation

## 2022-06-15 DIAGNOSIS — F1010 Alcohol abuse, uncomplicated: Secondary | ICD-10-CM

## 2022-06-15 LAB — LIPID PANEL
Cholesterol: 216 mg/dL — ABNORMAL HIGH (ref 0–200)
HDL: 141.1 mg/dL (ref >39.00)
LDL Cholesterol: 46 mg/dL (ref 0–99)
NonHDL: 74.94
Total CHOL/HDL Ratio: 2
Triglycerides: 146 mg/dL (ref 0.0–149.0)
VLDL: 29.2 mg/dL (ref 0.0–40.0)

## 2022-06-15 LAB — BASIC METABOLIC PANEL
BUN: 12 mg/dL (ref 6–23)
CO2: 26 mEq/L (ref 19–32)
Calcium: 8.7 mg/dL (ref 8.4–10.5)
Chloride: 101 mEq/L (ref 96–112)
Creatinine, Ser: 1.54 mg/dL — ABNORMAL HIGH (ref 0.40–1.50)
GFR: 49.15 mL/min — ABNORMAL LOW (ref >60.00)
Glucose, Bld: 149 mg/dL — ABNORMAL HIGH (ref 70–99)
Potassium: 3.2 mEq/L — ABNORMAL LOW (ref 3.5–5.1)
Sodium: 140 mEq/L (ref 135–145)

## 2022-06-15 LAB — HEPATIC FUNCTION PANEL
ALT: 179 U/L — ABNORMAL HIGH (ref 0–53)
AST: 478 U/L — ABNORMAL HIGH (ref 0–37)
Albumin: 4.4 g/dL (ref 3.5–5.2)
Alkaline Phosphatase: 152 U/L — ABNORMAL HIGH (ref 39–117)
Bilirubin, Direct: 0.3 mg/dL (ref 0.0–0.3)
Total Bilirubin: 1.1 mg/dL (ref 0.2–1.2)
Total Protein: 7.9 g/dL (ref 6.0–8.3)

## 2022-06-15 LAB — VITAMIN D 25 HYDROXY (VIT D DEFICIENCY, FRACTURES): VITD: 7.8 ng/mL — ABNORMAL LOW (ref 30.00–100.00)

## 2022-06-15 LAB — HEMOGLOBIN A1C: Hgb A1c MFr Bld: 6.7 % — ABNORMAL HIGH (ref 4.6–6.5)

## 2022-06-15 MED ORDER — AMLODIPINE BESYLATE 10 MG PO TABS: 10.0000 mg | ORAL_TABLET | Freq: Every day | ORAL | 1 refills | Status: DC

## 2022-06-15 NOTE — Assessment & Plan Note (Addendum)
States he did not get message about abnormal liver enzymes, hence he continued to consume ETOH (several shots of liquor every weekend) and takes crestor. He denies any GI symptoms or bruising or jaundice. He denies any ETOH withdrawal symptoms during the week.  We reviewed previous lab results. Advised about risk of alcoholic fatty liver disease which can lead to cirrhosis, liver cancer, bleeding disorder, CHF, stomach cancer etc. Advised to hold crestor and stop ETOH consumption Repeat hepatic panel: worsening AST, ALT and ALP. Normal bilirubin Check hepatitis panel: negative Check ASH fibrosure Entered referral to GI

## 2022-06-15 NOTE — Progress Notes (Signed)
Established Patient Visit  Patient: Corey Mcguire   DOB: 1960-09-06   62 y.o. Male  MRN: [id number] Visit Date: 06/20/2022  Subjective:    Chief Complaint  Patient presents with   Medical Management of Chronic Issues    HTN/CHOL/DM - Med refills-  Amlodipine and Crestor    HPI Alcoholic liver disease >>ASSESSMENT AND PLAN FOR ETOH ABUSE WRITTEN ON 06/20/2022  3:40 PM BY Gitel Beste LUM, NP  Advised to start Start B12 1055mcg and folate 69mcg daily Referred to GI  Elevated liver transaminase level States he did not get message about abnormal liver enzymes, hence he continued to consume ETOH (several shots of liquor every weekend) and takes crestor. He denies any GI symptoms or bruising or jaundice. He denies any ETOH withdrawal symptoms during the week.  We reviewed previous lab results. Advised about risk of alcoholic fatty liver disease which can lead to cirrhosis, liver cancer, bleeding disorder, CHF, stomach cancer etc. Advised to hold crestor and stop ETOH consumption Repeat hepatic panel: worsening AST, ALT and ALP. Normal bilirubin Check hepatitis panel: negative Check ASH fibrosure Entered referral to GI  Stage 3 chronic kidney disease due to diabetes mellitus Repeat renal function: decline, stage 3b No diuretic use Current use of ARB Encourage adequate oral hydration, stop ETOH consumption, stop all OTC NSAIDs. Pending UACr  ETOH abuse Advised to start Start B12 1048mcg and folate 40mcg daily  Type 2 diabetes mellitus with other specified complication ([place]) Repeat hgbA1c: 6.7% Controlled with metformin Advised to schedule annual eye exam  Hyperlipidemia associated with type 2 diabetes mellitus ([place]) Hold crestor due to elevated ALP, AST and ALT Repeat lipid panel: LDL at goal   Reviewed medical, surgical, and social history today  Medications: Outpatient Medications Prior to Visit  Medication Sig   acetaminophen (TYLENOL 8 HOUR  ARTHRITIS PAIN) 650 MG CR tablet Take 1 tablet (650 mg total) by mouth every 8 (eight) hours as needed for pain.   Cholecalciferol (VITAMIN D3) 50 MCG (2000 UT) TABS    famotidine (PEPCID) 20 MG tablet Take 1 tablet (20 mg total) by mouth at bedtime.   metFORMIN (GLUCOPHAGE-XR) 500 MG 24 hr tablet    omeprazole (PRILOSEC) 20 MG capsule Take 1 capsule (20 mg total) by mouth every morning.   rosuvastatin (CRESTOR) 20 MG tablet Take 1 tablet (20 mg total) by mouth daily.   sildenafil (VIAGRA) 50 MG tablet TAKE 1 TO 2 TABLETS BY MOUTH DAILY AS NEEDED FOR ERECTILE DYSFUNCTION   valsartan (DIOVAN) 80 MG tablet Take 1 tablet (80 mg total) by mouth daily.   [DISCONTINUED] potassium chloride SA (KLOR-CON M) 20 MEQ tablet Take 1 tablet (20 mEq total) by mouth 2 (two) times daily.   [DISCONTINUED] amLODipine (NORVASC) 10 MG tablet Take 1 tablet (10 mg total) by mouth at bedtime.   [DISCONTINUED] amoxicillin-clavulanate (AUGMENTIN) 875-125 MG tablet Take 1 tablet by mouth 2 (two) times daily. (Patient not taking: Reported on 06/15/2022)   [DISCONTINUED] valsartan-hydrochlorothiazide (DIOVAN-HCT) 320-25 MG tablet  (Patient not taking: Reported on 06/15/2022)   [DISCONTINUED] zaleplon (SONATA) 5 MG capsule Take 1 capsule (5 mg total) by mouth at bedtime as needed for sleep. (Patient not taking: Reported on 06/15/2022)   No facility-administered medications prior to visit.   Reviewed past medical and social history.   ROS per HPI above      Objective:  BP 120/84 (BP Location: Left Arm,  Patient Position: Sitting, Cuff Size: Large)   Pulse 83   Temp 98.7 F (37.1 C) (Temporal)   Resp 16   Ht 5\' 7"  (1.702 m)   Wt 216 lb 9.6 oz (98.2 kg)   SpO2 98%   BMI 33.92 kg/m      Physical Exam Vitals reviewed.  Eyes:     General: No scleral icterus.    Extraocular Movements: Extraocular movements intact.     Conjunctiva/sclera: Conjunctivae normal.  Cardiovascular:     Rate and Rhythm: Normal rate and  regular rhythm.     Pulses: Normal pulses.     Heart sounds: Normal heart sounds.  Pulmonary:     Effort: Pulmonary effort is normal.     Breath sounds: Normal breath sounds.  Abdominal:     General: Bowel sounds are normal.     Palpations: Abdomen is soft.  Musculoskeletal:     Right lower leg: No edema.     Left lower leg: No edema.  Neurological:     Mental Status: He is alert and oriented to person, place, and time.     Results for orders placed or performed in visit on 06/15/22  Hemoglobin A1c  Result Value Ref Range   Hgb A1c MFr Bld 6.7 (H) 4.6 - 6.5 %  Hepatic function panel  Result Value Ref Range   Total Bilirubin 1.1 0.2 - 1.2 mg/dL   Bilirubin, Direct 0.3 0.0 - 0.3 mg/dL   Alkaline Phosphatase 152 (H) 39 - 117 U/L   AST 478 (H) 0 - 37 U/L   ALT 179 (H) 0 - 53 U/L   Total Protein 7.9 6.0 - 8.3 g/dL   Albumin 4.4 3.5 - 5.2 g/dL  Basic metabolic panel  Result Value Ref Range   Sodium 140 135 - 145 mEq/L   Potassium 3.2 (L) 3.5 - 5.1 mEq/L   Chloride 101 96 - 112 mEq/L   CO2 26 19 - 32 mEq/L   Glucose, Bld 149 (H) 70 - 99 mg/dL   BUN 12 6 - 23 mg/dL   Creatinine, Ser 1.54 (H) 0.40 - 1.50 mg/dL   GFR 49.15 (L) >60.00 mL/min   Calcium 8.7 8.4 - 10.5 mg/dL  Lipid panel  Result Value Ref Range   Cholesterol 216 (H) 0 - 200 mg/dL   Triglycerides 146.0 0.0 - 149.0 mg/dL   HDL 141.10 >39.00 mg/dL   VLDL 29.2 0.0 - 40.0 mg/dL   LDL Cholesterol 46 0 - 99 mg/dL   Total CHOL/HDL Ratio 2    NonHDL 74.94   Hepatitis, Acute  Result Value Ref Range   Hep A IgM NON-REACTIVE NON-REACTIVE   Hepatitis B Surface Ag NON-REACTIVE NON-REACTIVE   Hep B C IgM NON-REACTIVE NON-REACTIVE   Hepatitis C Ab NON-REACTIVE NON-REACTIVE  VITAMIN D 25 Hydroxy (Vit-D Deficiency, Fractures)  Result Value Ref Range   VITD 7.80 (L) 30.00 - 100.00 ng/mL      Assessment & Plan:    Problem List Items Addressed This Visit       Cardiovascular and Mediastinum   Aortic atherosclerosis    Relevant Medications   amLODipine (NORVASC) 10 MG tablet   Other Relevant Orders   Lipid panel (Completed)   Essential hypertension - Primary   Relevant Medications   amLODipine (NORVASC) 10 MG tablet   Other Relevant Orders   Basic metabolic panel (Completed)     Digestive   Alcoholic liver disease    >>ASSESSMENT AND PLAN FOR ETOH ABUSE WRITTEN  ON 06/20/2022  3:40 PM BY Rozell Theiler LUM, NP  Advised to start Start B12 102mcg and folate 71mcg daily Referred to GI      Relevant Orders   ASH FibroSURE   Ambulatory referral to Gastroenterology     Endocrine   Hyperlipidemia associated with type 2 diabetes mellitus    Hold crestor due to elevated ALP, AST and ALT Repeat lipid panel: LDL at goal      Relevant Medications   amLODipine (NORVASC) 10 MG tablet   metFORMIN (GLUCOPHAGE-XR) 500 MG 24 hr tablet   Other Relevant Orders   Lipid panel (Completed)   Stage 3 chronic kidney disease due to diabetes mellitus    Repeat renal function: decline, stage 3b No diuretic use Current use of ARB Encourage adequate oral hydration, stop ETOH consumption, stop all OTC NSAIDs. Pending UACr      Relevant Medications   metFORMIN (GLUCOPHAGE-XR) 500 MG 24 hr tablet   Type 2 diabetes mellitus with other specified complication    Repeat [id number]: 6.7% Controlled with metformin Advised to schedule annual eye exam      Relevant Medications   metFORMIN (GLUCOPHAGE-XR) 500 MG 24 hr tablet   Other Relevant Orders   Hemoglobin A1c (Completed)     Other   Elevated liver transaminase level    States he did not get message about abnormal liver enzymes, hence he continued to consume ETOH (several shots of liquor every weekend) and takes crestor. He denies any GI symptoms or bruising or jaundice. He denies any ETOH withdrawal symptoms during the week.  We reviewed previous lab results. Advised about risk of alcoholic fatty liver disease which can lead to cirrhosis, liver cancer, bleeding  disorder, CHF, stomach cancer etc. Advised to hold crestor and stop ETOH consumption Repeat hepatic panel: worsening AST, ALT and ALP. Normal bilirubin Check hepatitis panel: negative Check ASH fibrosure Entered referral to GI      Relevant Orders   Hepatic function panel (Completed)   Hepatitis, Acute (Completed)   ASH FibroSURE   Ambulatory referral to Gastroenterology   Vitamin D deficiency   Relevant Medications   Vitamin D, Ergocalciferol, (DRISDOL) 1.25 MG (50000 UNIT) CAPS capsule   Other Relevant Orders   VITAMIN D 25 Hydroxy (Vit-D Deficiency, Fractures) (Completed)   Other Visit Diagnoses     ETOH abuse          Return in about 3 months (around 09/15/2022) for HTN, DM, hyperlipidemia (fasting).     Wilfred Lacy, NP

## 2022-06-15 NOTE — Patient Instructions (Signed)
Go to lab Hold crestor and stop all alcohol consumption. Maintain heart healthy diet and regular exercise (170mins per week) Start B12 1045mcg and folate 33mcg daily

## 2022-06-16 DIAGNOSIS — F1010 Alcohol abuse, uncomplicated: Secondary | ICD-10-CM | POA: Insufficient documentation

## 2022-06-16 DIAGNOSIS — K709 Alcoholic liver disease, unspecified: Secondary | ICD-10-CM | POA: Insufficient documentation

## 2022-06-16 LAB — HEPATITIS PANEL, ACUTE
Hep A IgM: NONREACTIVE
Hep B C IgM: NONREACTIVE
Hepatitis B Surface Ag: NONREACTIVE
Hepatitis C Ab: NONREACTIVE

## 2022-06-16 MED ORDER — VITAMIN D (ERGOCALCIFEROL) 1.25 MG (50000 UNIT) PO CAPS: 50000.0000 [IU] | ORAL_CAPSULE | ORAL | 0 refills | Status: DC

## 2022-06-20 ENCOUNTER — Telehealth: Payer: Self-pay | Admitting: Nurse Practitioner

## 2022-06-20 NOTE — Progress Notes (Signed)
Left message to return call 

## 2022-06-20 NOTE — Telephone Encounter (Signed)
Pt returned call for lab results. Waited less than 2 min and disconnected call.   Call back [phone]

## 2022-06-20 NOTE — Assessment & Plan Note (Signed)
Repeat hgbA1c: 6.7% Controlled with metformin Advised to schedule annual eye exam

## 2022-06-20 NOTE — Telephone Encounter (Signed)
Called Pt back and made him aware of lab results

## 2022-06-20 NOTE — Assessment & Plan Note (Addendum)
>>  ASSESSMENT AND PLAN FOR ETOH ABUSE WRITTEN ON 06/20/2022  3:40 PM BY Fay Swider LUM, NP  Advised to start Start B12 1028mcg and folate 75mcg daily Referred to GI

## 2022-06-20 NOTE — Assessment & Plan Note (Signed)
Hold crestor due to elevated ALP, AST and ALT Repeat lipid panel: LDL at goal

## 2022-06-20 NOTE — Assessment & Plan Note (Addendum)
Repeat renal function: decline, stage 3b No diuretic use Current use of ARB Encourage adequate oral hydration, stop ETOH consumption, stop all OTC NSAIDs. Pending UACr

## 2022-06-20 NOTE — Assessment & Plan Note (Signed)
Advised to start Start B12 1055mcg and folate 17mcg daily

## 2022-06-24 ENCOUNTER — Other Ambulatory Visit: Payer: BC Managed Care – PPO

## 2022-06-29 ENCOUNTER — Other Ambulatory Visit: Payer: BC Managed Care – PPO

## 2022-07-01 ENCOUNTER — Other Ambulatory Visit: Payer: BC Managed Care – PPO

## 2022-07-02 ENCOUNTER — Other Ambulatory Visit: Payer: Self-pay | Admitting: Nurse Practitioner

## 2022-07-02 DIAGNOSIS — N521 Erectile dysfunction due to diseases classified elsewhere: Secondary | ICD-10-CM

## 2022-07-06 ENCOUNTER — Other Ambulatory Visit: Payer: BC Managed Care – PPO

## 2022-07-08 ENCOUNTER — Other Ambulatory Visit: Payer: BC Managed Care – PPO

## 2022-08-01 ENCOUNTER — Other Ambulatory Visit: Payer: Self-pay | Admitting: Nurse Practitioner

## 2022-08-01 DIAGNOSIS — K2100 Gastro-esophageal reflux disease with esophagitis, without bleeding: Secondary | ICD-10-CM

## 2022-08-30 ENCOUNTER — Other Ambulatory Visit: Payer: Self-pay | Admitting: Nurse Practitioner

## 2022-08-30 DIAGNOSIS — N521 Erectile dysfunction due to diseases classified elsewhere: Secondary | ICD-10-CM

## 2022-09-09 ENCOUNTER — Other Ambulatory Visit: Payer: Self-pay | Admitting: Nurse Practitioner

## 2022-09-09 DIAGNOSIS — E559 Vitamin D deficiency, unspecified: Secondary | ICD-10-CM

## 2022-12-03 ENCOUNTER — Other Ambulatory Visit: Payer: Self-pay | Admitting: Nurse Practitioner

## 2022-12-03 DIAGNOSIS — N521 Erectile dysfunction due to diseases classified elsewhere: Secondary | ICD-10-CM

## 2022-12-09 ENCOUNTER — Other Ambulatory Visit: Payer: Self-pay | Admitting: Nurse Practitioner

## 2022-12-09 DIAGNOSIS — N521 Erectile dysfunction due to diseases classified elsewhere: Secondary | ICD-10-CM

## 2022-12-20 ENCOUNTER — Other Ambulatory Visit: Payer: Self-pay | Admitting: Nurse Practitioner

## 2022-12-20 DIAGNOSIS — I10 Essential (primary) hypertension: Secondary | ICD-10-CM

## 2022-12-20 DIAGNOSIS — E559 Vitamin D deficiency, unspecified: Secondary | ICD-10-CM

## 2022-12-23 ENCOUNTER — Other Ambulatory Visit: Payer: Self-pay | Admitting: Nurse Practitioner

## 2022-12-23 DIAGNOSIS — E559 Vitamin D deficiency, unspecified: Secondary | ICD-10-CM

## 2023-02-07 ENCOUNTER — Other Ambulatory Visit: Payer: Self-pay | Admitting: Nurse Practitioner

## 2023-02-07 DIAGNOSIS — K2100 Gastro-esophageal reflux disease with esophagitis, without bleeding: Secondary | ICD-10-CM

## 2023-02-15 ENCOUNTER — Encounter: Payer: Self-pay | Admitting: Nurse Practitioner

## 2023-02-15 ENCOUNTER — Ambulatory Visit
Admission: RE | Admit: 2023-02-15 | Discharge: 2023-02-15 | Disposition: A | Discharge status: Home / Self Care | Payer: BC Managed Care – PPO | Source: Ambulatory Visit | Attending: Nurse Practitioner

## 2023-02-15 ENCOUNTER — Ambulatory Visit: Payer: BC Managed Care – PPO | Admitting: Nurse Practitioner

## 2023-02-15 VITALS — BP 190/100 | HR 75 | Temp 97.9°F | Wt 197.2 lb

## 2023-02-15 DIAGNOSIS — I10 Essential (primary) hypertension: Secondary | ICD-10-CM

## 2023-02-15 DIAGNOSIS — K709 Alcoholic liver disease, unspecified: Secondary | ICD-10-CM

## 2023-02-15 DIAGNOSIS — R7401 Elevation of levels of liver transaminase levels: Secondary | ICD-10-CM

## 2023-02-15 DIAGNOSIS — G8929 Other chronic pain: Secondary | ICD-10-CM

## 2023-02-15 DIAGNOSIS — E559 Vitamin D deficiency, unspecified: Secondary | ICD-10-CM

## 2023-02-15 DIAGNOSIS — E1169 Type 2 diabetes mellitus with other specified complication: Secondary | ICD-10-CM | POA: Diagnosis not present

## 2023-02-15 DIAGNOSIS — E785 Hyperlipidemia, unspecified: Secondary | ICD-10-CM

## 2023-02-15 DIAGNOSIS — M25552 Pain in left hip: Secondary | ICD-10-CM

## 2023-02-15 DIAGNOSIS — E1122 Type 2 diabetes mellitus with diabetic chronic kidney disease: Secondary | ICD-10-CM | POA: Diagnosis not present

## 2023-02-15 DIAGNOSIS — E876 Hypokalemia: Secondary | ICD-10-CM

## 2023-02-15 DIAGNOSIS — N183 Chronic kidney disease, stage 3 unspecified: Secondary | ICD-10-CM

## 2023-02-15 DIAGNOSIS — Q6589 Other specified congenital deformities of hip: Secondary | ICD-10-CM | POA: Insufficient documentation

## 2023-02-15 LAB — COMPREHENSIVE METABOLIC PANEL
ALT: 36 U/L (ref 0–53)
AST: 148 U/L — ABNORMAL HIGH (ref 0–37)
Albumin: 4 g/dL (ref 3.5–5.2)
Alkaline Phosphatase: 116 U/L (ref 39–117)
BUN: 11 mg/dL (ref 6–23)
CO2: 28 meq/L (ref 19–32)
Calcium: 8.5 mg/dL (ref 8.4–10.5)
Chloride: 97 meq/L (ref 96–112)
Creatinine, Ser: 1.22 mg/dL (ref 0.40–1.50)
GFR: 64.7 mL/min (ref >60.00)
Glucose, Bld: 126 mg/dL — ABNORMAL HIGH (ref 70–99)
Potassium: 2.7 meq/L — CL (ref 3.5–5.1)
Sodium: 137 meq/L (ref 135–145)
Total Bilirubin: 1.4 mg/dL — ABNORMAL HIGH (ref 0.2–1.2)
Total Protein: 7.4 g/dL (ref 6.0–8.3)

## 2023-02-15 LAB — LIPID PANEL
Cholesterol: 330 mg/dL — ABNORMAL HIGH (ref 0–200)
HDL: 96 mg/dL (ref >39.00)
Total CHOL/HDL Ratio: 3
Triglycerides: 1239 mg/dL — ABNORMAL HIGH (ref 0.0–149.0)

## 2023-02-15 LAB — VITAMIN D 25 HYDROXY (VIT D DEFICIENCY, FRACTURES): VITD: 22.12 ng/mL — ABNORMAL LOW (ref 30.00–100.00)

## 2023-02-15 LAB — HEMOGLOBIN A1C: Hgb A1c MFr Bld: 6.1 % (ref 4.6–6.5)

## 2023-02-15 LAB — LDL CHOLESTEROL, DIRECT: Direct LDL: 86 mg/dL

## 2023-02-15 MED ORDER — PREDNISONE 20 MG PO TABS: ORAL_TABLET | ORAL | 0 refills | Status: AC

## 2023-02-15 MED ORDER — POTASSIUM CHLORIDE CRYS ER 20 MEQ PO TBCR: 40.0000 meq | EXTENDED_RELEASE_TABLET | Freq: Every day | ORAL | 0 refills | Status: DC

## 2023-02-15 MED ORDER — VITAMIN D3 125 MCG (5000 UT) PO CAPS: 5000.0000 [IU] | ORAL_CAPSULE | Freq: Every day | ORAL | Status: AC

## 2023-02-15 MED ORDER — FENOFIBRATE 145 MG PO TABS: 145.0000 mg | ORAL_TABLET | Freq: Every day | ORAL | 1 refills | Status: DC

## 2023-02-15 MED ORDER — AMLODIPINE BESYLATE 10 MG PO TABS: 10.0000 mg | ORAL_TABLET | Freq: Every day | ORAL | 1 refills | Status: DC

## 2023-02-15 MED ORDER — LANCET DEVICE MISC: 1.0000 | Freq: Every day | 0 refills | Status: DC

## 2023-02-15 MED ORDER — BLOOD GLUCOSE MONITORING SUPPL DEVI: 1.0000 | Freq: Three times a day (TID) | 0 refills | Status: AC

## 2023-02-15 MED ORDER — VALSARTAN 160 MG PO TABS: 160.0000 mg | ORAL_TABLET | Freq: Every day | ORAL | 1 refills | Status: DC

## 2023-02-15 MED ORDER — LANCETS MISC. MISC: 1.0000 | Freq: Every day | 3 refills | Status: AC

## 2023-02-15 MED ORDER — ROSUVASTATIN CALCIUM 20 MG PO TABS: 20.0000 mg | ORAL_TABLET | Freq: Every day | ORAL | 1 refills | Status: DC

## 2023-02-15 MED ORDER — BLOOD GLUCOSE TEST VI STRP: 1.0000 | ORAL_STRIP | Freq: Every day | 3 refills | Status: AC

## 2023-02-15 NOTE — Patient Instructions (Addendum)
Go to lab Maintain DASH diet Resume BP meds Use tylenol 325mg  every 8hrs for pain Go to [address] for hip x-ray  DASH Eating Plan DASH stands for Dietary Approaches to Stop Hypertension. The DASH eating plan is a healthy eating plan that has been shown to: Lower high blood pressure (hypertension). Reduce your risk for type 2 diabetes, heart disease, and stroke. Help with weight loss. What are tips for following this plan? Reading food labels Check food labels for the amount of salt (sodium) per serving. Choose foods with less than 5 percent of the Daily Value (DV) of sodium. In general, foods with less than 300 milligrams (mg) of sodium per serving fit into this eating plan. To find whole grains, look for the word "whole" as the first word in the ingredient list. Shopping Buy products labeled as "low-sodium" or "no salt added." Buy fresh foods. Avoid canned foods and pre-made or frozen meals. Cooking Try not to add salt when you cook. Use salt-free seasonings or herbs instead of table salt or sea salt. Check with your health care provider or pharmacist before using salt substitutes. Do not fry foods. Cook foods in healthy ways, such as baking, boiling, grilling, roasting, or broiling. Cook using oils that are good for your heart. These include olive, canola, avocado, soybean, and sunflower oil. Meal planning  Eat a balanced diet. This should include: 4 or more servings of fruits and 4 or more servings of vegetables each day. Try to fill half of your plate with fruits and vegetables. 6-8 servings of whole grains each day. 6 or less servings of lean meat, poultry, or fish each day. 1 oz is 1 serving. A 3 oz (85 g) serving of meat is about the same size as the palm of your hand. One egg is 1 oz (28 g). 2-3 servings of low-fat dairy each day. One serving is 1 cup (237 mL). 1 serving of nuts, seeds, or beans 5 times each week. 2-3 servings of heart-healthy fats. Healthy fats called  omega-3 fatty acids are found in foods such as walnuts, flaxseeds, fortified milks, and eggs. These fats are also found in cold-water fish, such as sardines, salmon, and mackerel. Limit how much you eat of: Canned or prepackaged foods. Food that is high in trans fat, such as fried foods. Food that is high in saturated fat, such as fatty meat. Desserts and other sweets, sugary drinks, and other foods with added sugar. Full-fat dairy products. Do not salt foods before eating. Do not eat more than 4 egg yolks a week. Try to eat at least 2 vegetarian meals a week. Eat more home-cooked food and less restaurant, buffet, and fast food. Lifestyle When eating at a restaurant, ask if your food can be made with less salt or no salt. If you drink alcohol: Limit how much you have to: 0-1 drink a day if you are male. 0-2 drinks a day if you are male. Know how much alcohol is in your drink. In the [place] one drink is one 12 oz bottle of beer (355 mL), one 5 oz glass of wine (148 mL), or one 1 oz glass of hard liquor (44 mL). General information Avoid eating more than 2,300 mg of salt a day. If you have hypertension, you may need to reduce your sodium intake to 1,500 mg a day. Work with your provider to stay at a healthy body weight or lose weight. Ask what the best weight range is for you.  On most days of the week, get at least 30 minutes of exercise that causes your heart to beat faster. This may include walking, swimming, or biking. Work with your provider or dietitian to adjust your eating plan to meet your specific calorie needs. What foods should I eat? Fruits All fresh, dried, or frozen fruit. Canned fruits that are in their natural juice and do not have sugar added to them. Vegetables Fresh or frozen vegetables that are raw, steamed, roasted, or grilled. Low-sodium or reduced-sodium tomato and vegetable juice. Low-sodium or reduced-sodium tomato sauce and tomato paste. Low-sodium or  reduced-sodium canned vegetables. Grains Whole-grain or whole-wheat bread. Whole-grain or whole-wheat pasta. Brown rice. Orpah Cobb. Bulgur. Whole-grain and low-sodium cereals. Pita bread. Low-fat, low-sodium crackers. Whole-wheat flour tortillas. Meats and other proteins Skinless chicken or [place]. Ground chicken or [place]. Pork with fat trimmed off. Fish and seafood. Egg whites. Dried beans, peas, or lentils. Unsalted nuts, nut butters, and seeds. Unsalted canned beans. Lean cuts of beef with fat trimmed off. Low-sodium, lean precooked or cured meat, such as sausages or meat loaves. Dairy Low-fat (1%) or fat-free (skim) milk. Reduced-fat, low-fat, or fat-free cheeses. Nonfat, low-sodium ricotta or cottage cheese. Low-fat or nonfat yogurt. Low-fat, low-sodium cheese. Fats and oils Soft margarine without trans fats. Vegetable oil. Reduced-fat, low-fat, or light mayonnaise and salad dressings (reduced-sodium). Canola, safflower, olive, avocado, soybean, and sunflower oils. Avocado. Seasonings and condiments Herbs. Spices. Seasoning mixes without salt. Other foods Unsalted popcorn and pretzels. Fat-free sweets. The items listed above may not be all the foods and drinks you can have. Talk to a dietitian to learn more. What foods should I avoid? Fruits Canned fruit in a light or heavy syrup. Fried fruit. Fruit in cream or butter sauce. Vegetables Creamed or fried vegetables. Vegetables in a cheese sauce. Regular canned vegetables that are not marked as low-sodium or reduced-sodium. Regular canned tomato sauce and paste that are not marked as low-sodium or reduced-sodium. Regular tomato and vegetable juices that are not marked as low-sodium or reduced-sodium. Rosita Fire. Olives. Grains Baked goods made with fat, such as croissants, muffins, or some breads. Dry pasta or rice meal packs. Meats and other proteins Fatty cuts of meat. Ribs. Fried meat. Tomasa Blase. Bologna, salami, and other precooked or  cured meats, such as sausages or meat loaves, that are not lean and low in sodium. Fat from the back of a pig (fatback). Bratwurst. Salted nuts and seeds. Canned beans with added salt. Canned or smoked fish. Whole eggs or egg yolks. Chicken or [place] with skin. Dairy Whole or 2% milk, cream, and half-and-half. Whole or full-fat cream cheese. Whole-fat or sweetened yogurt. Full-fat cheese. Nondairy creamers. Whipped toppings. Processed cheese and cheese spreads. Fats and oils Butter. Stick margarine. Lard. Shortening. Ghee. Bacon fat. Tropical oils, such as coconut, palm kernel, or palm oil. Seasonings and condiments Onion salt, garlic salt, seasoned salt, table salt, and sea salt. Worcestershire sauce. Tartar sauce. Barbecue sauce. Teriyaki sauce. Soy sauce, including reduced-sodium soy sauce. Steak sauce. Canned and packaged gravies. Fish sauce. Oyster sauce. Cocktail sauce. Store-bought horseradish. Ketchup. Mustard. Meat flavorings and tenderizers. Bouillon cubes. Hot sauces. Pre-made or packaged marinades. Pre-made or packaged taco seasonings. Relishes. Regular salad dressings. Other foods Salted popcorn and pretzels. The items listed above may not be all the foods and drinks you should avoid. Talk to a dietitian to learn more. Where to find more information National Heart, Lung, and Blood Institute (NHLBI): BuffaloDryCleaner.gl American Heart Association (AHA): heart.org Academy of Nutrition  and Dietetics: eatright.org National Kidney Foundation (NKF): kidney.org This information is not intended to replace advice given to you by your health care provider. Make sure you discuss any questions you have with your health care provider. Document Revised: 03/24/2022 Document Reviewed: 03/24/2022 Elsevier Patient Education  2024 [employer].

## 2023-02-15 NOTE — Assessment & Plan Note (Addendum)
Improved liver enzymes but not at normal range Very elevated triglyceride: maintain crestor, start fenofibrate. New PRESCRIPTION sent Advised to maintain heart healthy diet and minimize ALCOHOL intake, avoid nicotine use Repeat lipid panel in 35month (fasting)

## 2023-02-15 NOTE — Assessment & Plan Note (Signed)
Repeat hgbA1c: 6.1% improved No need for medication at this time

## 2023-02-15 NOTE — Progress Notes (Signed)
Established Patient Visit  Patient: Corey Mcguire   DOB: 1961/02/27   62 y.o. Male  MRN: [id number] Visit Date: 02/15/2023  Subjective:    Chief Complaint  Patient presents with   OFFICE VISIT    PT C/O of left hip and leg pain for a few months; no fall or injury occurred. PT didn't take any OTC medication or used hot/cold remedies for pain. PT also need RX refills and is due for flu and TDAP vaccine, eye exam.    HPI Hyperlipidemia associated with type 2 diabetes mellitus (HCC) Improved liver enzymes but not at normal range Very elevated triglyceride: maintain crestor, start fenofibrate. New PRESCRIPTION sent Advised to maintain heart healthy diet and minimize ALCOHOL intake, avoid nicotine use Repeat lipid panel in 28month (fasting)   Type 2 diabetes mellitus with other specified complication (HCC) Repeat hgbA1c: 6.1% improved No need for medication at this time  Elevated liver transaminase level Hx of fatty liver Repeat [id number] but persistent elevated AST and mildly elevated total bilirubin, normal albumin Pending ASH fibrosure Advised to minimize ALCOHOL intake to 1beer daily to avoid withdrawal symptoms.  Vitamin D deficiency Repeat vit D: improved but still below 30: advised to start OVER THE COUNTER dose 5000IU daily  Essential hypertension Very elevated BP today due to me non compliance. asymptomatic States he has been out of meds x 3months. Persistent ALCOHOL and nicotine consumption Admits to diet non compliance. BP Readings from Last 3 Encounters:  02/15/23 (!) 190/100  06/15/22 120/84  03/17/22 122/78    Sent med refills Maintain amlodipine dose  Increase valsartan dose to 160mg  Check CMP: stable renal function Advised patient and wife about CVA and MI symptoms. Advised to call 911 if present. Advised to maintain DASH diet F/up in 1week  Chronic left hip pain Left lateral and gluteal hip pain: onset 6months ago, associated with  abnormal gain and weakness. Denies any back pain or groin pain or injury or paresthesia. No OVER THE COUNTER med used  Get hip x-ray With elevated BP, advised to use tylenol 325mg  every 6hrs as needed for pain Sent oral prednisone  Reviewed medical, surgical, and social history today  Medications: Outpatient Medications Prior to Visit  Medication Sig   famotidine (PEPCID) 20 MG tablet TAKE 1 TABLET BY MOUTH AT BEDTIME   metFORMIN (GLUCOPHAGE-XR) 500 MG 24 hr tablet    omeprazole (PRILOSEC) 20 MG capsule TAKE 1 CAPSULE BY MOUTH EVERY MORNING   sildenafil (VIAGRA) 50 MG tablet TAKE 1 TO 2 TABLETS BY MOUTH DAILY AS NEEDED FOR ERECTILE DYSFUNCTION   [DISCONTINUED] amLODipine (NORVASC) 10 MG tablet Take 1 tablet (10 mg total) by mouth at bedtime.   [DISCONTINUED] Cholecalciferol (VITAMIN D3) 50 MCG (2000 UT) TABS    [DISCONTINUED] rosuvastatin (CRESTOR) 20 MG tablet Take 1 tablet (20 mg total) by mouth daily.   [DISCONTINUED] valsartan (DIOVAN) 80 MG tablet Take 1 tablet (80 mg total) by mouth daily.   [DISCONTINUED] Vitamin D, Ergocalciferol, (DRISDOL) 1.25 MG (50000 UNIT) CAPS capsule TAKE 1 CAPSULE BY MOUTH ONCE WEEKLY   acetaminophen (TYLENOL 8 HOUR ARTHRITIS PAIN) 650 MG CR tablet Take 1 tablet (650 mg total) by mouth every 8 (eight) hours as needed for pain. (Patient not taking: Reported on 02/15/2023)   No facility-administered medications prior to visit.   Reviewed past medical and social history.   ROS per HPI above      Objective:  BP (!) 190/100 (BP Location: Right Arm, Patient Position: Sitting, Cuff Size: Large) Comment: recheck BP reading done manual  Pulse 75   Temp 97.9 F (36.6 C) (Temporal)   Wt 197 lb 3.2 oz (89.4 kg)   SpO2 100%   BMI 30.89 kg/m      Physical Exam Vitals and nursing note reviewed.  Constitutional:      General: He is not in acute distress. Cardiovascular:     Rate and Rhythm: Normal rate and regular rhythm.     Pulses: Normal pulses.      Heart sounds: Normal heart sounds.  Pulmonary:     Effort: Pulmonary effort is normal.     Breath sounds: Normal breath sounds.  Musculoskeletal:     Lumbar back: Normal.     Right hip: Normal.     Left hip: Tenderness present. Normal range of motion. Decreased strength.     Right upper leg: Normal.     Left upper leg: Normal.  Neurological:     Mental Status: He is alert and oriented to person, place, and time.     Cranial Nerves: No cranial nerve deficit.     Results for orders placed or performed in visit on 02/15/23  VITAMIN D 25 Hydroxy (Vit-D Deficiency, Fractures)  Result Value Ref Range   VITD 22.12 (L) 30.00 - 100.00 ng/mL  Lipid panel  Result Value Ref Range   Cholesterol 330 (H) 0 - 200 mg/dL   Triglycerides (H) 0.0 - 149.0 mg/dL    1191.4 Triglyceride is over 400; calculations on Lipids are invalid.   HDL 96.00 >39.00 mg/dL   Total CHOL/HDL Ratio 3   Comprehensive metabolic panel  Result Value Ref Range   Sodium 137 135 - 145 mEq/L   Potassium 2.7 (LL) 3.5 - 5.1 mEq/L   Chloride 97 96 - 112 mEq/L   CO2 28 19 - 32 mEq/L   Glucose, Bld 126 (H) 70 - 99 mg/dL   BUN 11 6 - 23 mg/dL   Creatinine, Ser 7.82 0.40 - 1.50 mg/dL   Total Bilirubin 1.4 (H) 0.2 - 1.2 mg/dL   Alkaline Phosphatase 116 39 - 117 U/L   AST 148 (H) 0 - 37 U/L   ALT 36 0 - 53 U/L   Total Protein 7.4 6.0 - 8.3 g/dL   Albumin 4.0 3.5 - 5.2 g/dL   GFR 95.62 >13.08 mL/min   Calcium 8.5 8.4 - 10.5 mg/dL  Hemoglobin M5H  Result Value Ref Range   Hgb A1c MFr Bld 6.1 4.6 - 6.5 %  LDL cholesterol, direct  Result Value Ref Range   Direct LDL 86.0 mg/dL      Assessment & Plan:    Problem List Items Addressed This Visit     Alcoholic liver disease (HCC)   Relevant Orders   ASH FibroSURE   Chronic left hip pain    Left lateral and gluteal hip pain: onset 6months ago, associated with abnormal gain and weakness. Denies any back pain or groin pain or injury or paresthesia. No OVER THE COUNTER  med used  Get hip x-ray With elevated BP, advised to use tylenol 325mg  every 6hrs as needed for pain Sent oral prednisone      Relevant Medications   predniSONE (DELTASONE) 20 MG tablet   Other Relevant Orders   DG Hip Unilat W OR W/O Pelvis 2-3 Views Left   Elevated liver transaminase level    Hx of fatty liver Repeat [id number] but persistent elevated  AST and mildly elevated total bilirubin, normal albumin Pending ASH fibrosure Advised to minimize ALCOHOL intake to 1beer daily to avoid withdrawal symptoms.      Relevant Orders   ASH FibroSURE   Essential hypertension - Primary    Very elevated BP today due to me non compliance. asymptomatic States he has been out of meds x 3months. Persistent ALCOHOL and nicotine consumption Admits to diet non compliance. BP Readings from Last 3 Encounters:  02/15/23 (!) 190/100  06/15/22 120/84  03/17/22 122/78    Sent med refills Maintain amlodipine dose  Increase valsartan dose to 160mg  Check CMP: stable renal function Advised patient and wife about CVA and MI symptoms. Advised to call 911 if present. Advised to maintain DASH diet F/up in 1week      Relevant Medications   amLODipine (NORVASC) 10 MG tablet   rosuvastatin (CRESTOR) 20 MG tablet   valsartan (DIOVAN) 160 MG tablet   fenofibrate (TRICOR) 145 MG tablet   Other Relevant Orders   Comprehensive metabolic panel (Completed)   Hyperlipidemia associated with type 2 diabetes mellitus (HCC)    Improved liver enzymes but not at normal range Very elevated triglyceride: maintain crestor, start fenofibrate. New PRESCRIPTION sent Advised to maintain heart healthy diet and minimize ALCOHOL intake, avoid nicotine use Repeat lipid panel in 67month (fasting)       Relevant Medications   amLODipine (NORVASC) 10 MG tablet   rosuvastatin (CRESTOR) 20 MG tablet   valsartan (DIOVAN) 160 MG tablet   fenofibrate (TRICOR) 145 MG tablet   Other Relevant Orders   Lipid panel  (Completed)   Stage 3 chronic kidney disease due to diabetes mellitus (HCC)   Relevant Medications   rosuvastatin (CRESTOR) 20 MG tablet   valsartan (DIOVAN) 160 MG tablet   Other Relevant Orders   Microalbumin / creatinine urine ratio   Comprehensive metabolic panel (Completed)   Type 2 diabetes mellitus with other specified complication (HCC)    Repeat hgbA1c: 6.1% improved No need for medication at this time      Relevant Medications   rosuvastatin (CRESTOR) 20 MG tablet   valsartan (DIOVAN) 160 MG tablet   Blood Glucose Monitoring Suppl DEVI   Glucose Blood (BLOOD GLUCOSE TEST STRIPS) STRP   Lancet Device MISC   Lancets Misc. MISC   Other Relevant Orders   Microalbumin / creatinine urine ratio   Comprehensive metabolic panel (Completed)   Hemoglobin A1c (Completed)   Vitamin D deficiency    Repeat vit D: improved but still below 30: advised to start OVER THE COUNTER dose 5000IU daily      Relevant Medications   Cholecalciferol (VITAMIN D3) 125 MCG (5000 UT) CAPS   Other Relevant Orders   VITAMIN D 25 Hydroxy (Vit-D Deficiency, Fractures) (Completed)   Other Visit Diagnoses     Hypokalemia       Relevant Medications   potassium chloride SA (KLOR-CON M) 20 MEQ tablet      Return in about 1 week (around 02/22/2023) for HTN.     Alysia Penna, NP

## 2023-02-15 NOTE — Assessment & Plan Note (Signed)
>>  ASSESSMENT AND PLAN FOR CHRONIC LEFT HIP PAIN WRITTEN ON 02/15/2023  3:40 PM BY Field Staniszewski LUM, NP  Left lateral and gluteal hip pain: onset 6months ago, associated with abnormal gain and weakness. Denies any back pain or groin pain or injury or paresthesia. No OVER THE COUNTER med used  Get hip x-ray With elevated BP, advised to use tylenol  325mg  every 6hrs as needed for pain Sent oral prednisone

## 2023-02-15 NOTE — Assessment & Plan Note (Addendum)
Hx of fatty liver Repeat [id number] but persistent elevated AST and mildly elevated total bilirubin, normal albumin Pending ASH fibrosure Advised to minimize ALCOHOL intake to 1beer daily to avoid withdrawal symptoms.

## 2023-02-15 NOTE — Assessment & Plan Note (Signed)
Very elevated BP today due to me non compliance. asymptomatic States he has been out of meds x 3months. Persistent ALCOHOL and nicotine consumption Admits to diet non compliance. BP Readings from Last 3 Encounters:  02/15/23 (!) 190/100  06/15/22 120/84  03/17/22 122/78    Sent med refills Maintain amlodipine dose  Increase valsartan dose to 160mg  Check CMP: stable renal function Advised patient and wife about CVA and MI symptoms. Advised to call 911 if present. Advised to maintain DASH diet F/up in 1week

## 2023-02-15 NOTE — Assessment & Plan Note (Signed)
Left lateral and gluteal hip pain: onset 6months ago, associated with abnormal gain and weakness. Denies any back pain or groin pain or injury or paresthesia. No OVER THE COUNTER med used  Get hip x-ray With elevated BP, advised to use tylenol 325mg  every 6hrs as needed for pain Sent oral prednisone

## 2023-02-15 NOTE — Assessment & Plan Note (Signed)
Repeat vit D: improved but still below 30: advised to start OVER THE COUNTER dose 5000IU daily

## 2023-02-21 ENCOUNTER — Telehealth: Payer: Self-pay | Admitting: Nurse Practitioner

## 2023-02-21 ENCOUNTER — Other Ambulatory Visit: Payer: Self-pay

## 2023-02-21 DIAGNOSIS — N521 Erectile dysfunction due to diseases classified elsewhere: Secondary | ICD-10-CM

## 2023-02-21 MED ORDER — SILDENAFIL CITRATE 50 MG PO TABS: 50.0000 mg | ORAL_TABLET | ORAL | 1 refills | Status: DC | PRN

## 2023-02-21 NOTE — Telephone Encounter (Signed)
Refill request  sildenafil (VIAGRA) 50 MG tablet [[id number]]   [facility] [id number] [place], [place] - [address] [facility] [address] [place], [place] [address] Phone: [phone]  Fax: [phone]

## 2023-02-24 LAB — ASH FIBROSURE
ALPHA 2-MACROGLOBULINS, QN: 149 mg/dL (ref 110–276)
ALT (SGPT) P5P: 46 [IU]/L (ref 0–55)
AST (SGOT) P5P: 204 [IU]/L — ABNORMAL HIGH (ref 0–40)
Apolipoprotein A-1: 328 mg/dL — ABNORMAL HIGH (ref 101–178)
Bilirubin, Total: 1 mg/dL (ref 0.0–1.2)
Cholesterol, Total: 355 mg/dL — ABNORMAL HIGH (ref 100–199)
GGT: 431 [IU]/L — ABNORMAL HIGH (ref 0–65)
Glucose: 126 mg/dL — ABNORMAL HIGH (ref 70–99)
Haptoglobin: <10 mg/dL — ABNORMAL LOW (ref 29–370)
Height: 67 in
Triglycerides: 1313 mg/dL (ref 0–149)
Weight: 197 [lb_av]

## 2023-02-27 NOTE — Addendum Note (Signed)
Addended by: Alysia Penna L on: 02/27/2023 04:08 PM   Modules accepted: Orders

## 2023-02-28 ENCOUNTER — Ambulatory Visit: Payer: BC Managed Care – PPO | Admitting: Nurse Practitioner

## 2023-03-02 ENCOUNTER — Other Ambulatory Visit: Payer: Self-pay | Admitting: Nurse Practitioner

## 2023-03-02 DIAGNOSIS — Q6589 Other specified congenital deformities of hip: Secondary | ICD-10-CM | POA: Insufficient documentation

## 2023-03-02 DIAGNOSIS — G8929 Other chronic pain: Secondary | ICD-10-CM

## 2023-03-03 ENCOUNTER — Ambulatory Visit: Payer: BC Managed Care – PPO | Admitting: Nurse Practitioner

## 2023-03-03 ENCOUNTER — Encounter: Payer: Self-pay | Admitting: Nurse Practitioner

## 2023-03-03 VITALS — BP 138/72 | HR 75 | Temp 98.8°F | Resp 18 | Wt 204.0 lb

## 2023-03-03 DIAGNOSIS — I10 Essential (primary) hypertension: Secondary | ICD-10-CM | POA: Diagnosis not present

## 2023-03-03 DIAGNOSIS — D539 Nutritional anemia, unspecified: Secondary | ICD-10-CM | POA: Diagnosis not present

## 2023-03-03 DIAGNOSIS — E785 Hyperlipidemia, unspecified: Secondary | ICD-10-CM

## 2023-03-03 DIAGNOSIS — G8929 Other chronic pain: Secondary | ICD-10-CM

## 2023-03-03 DIAGNOSIS — Z23 Encounter for immunization: Secondary | ICD-10-CM | POA: Diagnosis not present

## 2023-03-03 DIAGNOSIS — Q6589 Other specified congenital deformities of hip: Secondary | ICD-10-CM

## 2023-03-03 DIAGNOSIS — E1122 Type 2 diabetes mellitus with diabetic chronic kidney disease: Secondary | ICD-10-CM

## 2023-03-03 DIAGNOSIS — D509 Iron deficiency anemia, unspecified: Secondary | ICD-10-CM

## 2023-03-03 DIAGNOSIS — E1169 Type 2 diabetes mellitus with other specified complication: Secondary | ICD-10-CM | POA: Diagnosis not present

## 2023-03-03 DIAGNOSIS — M25552 Pain in left hip: Secondary | ICD-10-CM

## 2023-03-03 LAB — CBC WITH DIFFERENTIAL/PLATELET
Basophils Absolute: 0 10*3/uL (ref 0.0–0.1)
Basophils Relative: 0.3 % (ref 0.0–3.0)
Eosinophils Absolute: 0 10*3/uL (ref 0.0–0.7)
Eosinophils Relative: 0.3 % (ref 0.0–5.0)
HCT: 37.1 % — ABNORMAL LOW (ref 39.0–52.0)
Hemoglobin: 12.8 g/dL — ABNORMAL LOW (ref 13.0–17.0)
Lymphocytes Relative: 31.9 % (ref 12.0–46.0)
Lymphs Abs: 1.7 10*3/uL (ref 0.7–4.0)
MCHC: 34.4 g/dL (ref 30.0–36.0)
MCV: 105 fL — ABNORMAL HIGH (ref 78.0–100.0)
Monocytes Absolute: 0.6 10*3/uL (ref 0.1–1.0)
Monocytes Relative: 11 % (ref 3.0–12.0)
Neutro Abs: 3.1 10*3/uL (ref 1.4–7.7)
Neutrophils Relative %: 56.5 % (ref 43.0–77.0)
Platelets: 302 10*3/uL (ref 150.0–400.0)
RBC: 3.53 Mil/uL — ABNORMAL LOW (ref 4.22–5.81)
RDW: 14.8 % (ref 11.5–15.5)
WBC: 5.4 10*3/uL (ref 4.0–10.5)

## 2023-03-03 LAB — IBC + FERRITIN
Ferritin: 248.5 ng/mL (ref 22.0–322.0)
Iron: 75 ug/dL (ref 42–165)
Saturation Ratios: 18.4 % — ABNORMAL LOW (ref 20.0–50.0)
TIBC: 407.4 ug/dL (ref 250.0–450.0)
Transferrin: 291 mg/dL (ref 212.0–360.0)

## 2023-03-03 LAB — C-REACTIVE PROTEIN: CRP: <1 mg/dL (ref 0.5–20.0)

## 2023-03-03 LAB — SEDIMENTATION RATE: Sed Rate: 24 mm/h — ABNORMAL HIGH (ref 0–20)

## 2023-03-03 LAB — URIC ACID: Uric Acid, Serum: 4.8 mg/dL (ref 4.0–7.8)

## 2023-03-03 MED ORDER — IBUPROFEN 600 MG PO TABS: 600.0000 mg | ORAL_TABLET | Freq: Two times a day (BID) | ORAL | 2 refills | Status: DC | PRN

## 2023-03-03 NOTE — Assessment & Plan Note (Signed)
Repeat CBC and iron panel

## 2023-03-03 NOTE — Progress Notes (Signed)
Established Patient Visit  Patient: Corey Mcguire   DOB: 1960/11/24   62 y.o. Male  MRN: [id number] Visit Date: 03/03/2023  Subjective:    Chief Complaint  Patient presents with   OFFICE VISIT    Hypertension    1 week follow up. PT C/O of hip pain. He is due for FLU vaccine and eye exam    Hypertension   Bilateral hip dysplasia Chronic hip pain, L>R, Rate pain as moderate, intermittent. No previous hip trauma He played baseball, basketball and football from middle school to college. Entered order for MRI w/o contrast. Check ESR, ANA, CRP, uric acid Entered referral to ortho Sent ibuprofen 600mg  BID prn Advised to take med with food and to minimize ALCOHOL consumption to 1drink daily   Iron deficiency anemia Repeat CBC and iron panel  Essential hypertension BP at goal with med and diet compliance BP Readings from Last 3 Encounters:  03/03/23 138/72  02/15/23 (!) 190/100  06/15/22 120/84    Maintain med doses F/up in 3months   Reviewed medical, surgical, and social history today  Medications: Outpatient Medications Prior to Visit  Medication Sig   Accu-Chek Softclix Lancets lancets SMARTSIG:Topical   amLODipine (NORVASC) 10 MG tablet Take 1 tablet (10 mg total) by mouth at bedtime.   Blood Glucose Monitoring Suppl DEVI 1 each by Does not apply route in the morning, at noon, and at bedtime. May substitute to any manufacturer covered by patient's insurance.   Cholecalciferol (VITAMIN D3) 125 MCG (5000 UT) CAPS Take 1 capsule (5,000 Units total) by mouth daily.   famotidine (PEPCID) 20 MG tablet TAKE 1 TABLET BY MOUTH AT BEDTIME   fenofibrate (TRICOR) 145 MG tablet Take 1 tablet (145 mg total) by mouth daily.   Glucose Blood (BLOOD GLUCOSE TEST STRIPS) STRP Inject 1 each into the skin daily before breakfast. May substitute to any manufacturer covered by patient's insurance.   Lancet Device MISC 1 each by Does not apply route daily. May substitute to  any manufacturer covered by patient's insurance.   Lancets Misc. MISC 1 each by Does not apply route daily before breakfast. May substitute to any manufacturer covered by patient's insurance.   omeprazole (PRILOSEC) 20 MG capsule TAKE 1 CAPSULE BY MOUTH EVERY MORNING   potassium chloride SA (KLOR-CON M) 20 MEQ tablet Take 2 tablets (40 mEq total) by mouth daily.   rosuvastatin (CRESTOR) 20 MG tablet Take 1 tablet (20 mg total) by mouth daily.   sildenafil (VIAGRA) 50 MG tablet Take 1 tablet (50 mg total) by mouth as needed for erectile dysfunction.   valsartan (DIOVAN) 160 MG tablet Take 1 tablet (160 mg total) by mouth daily.   [DISCONTINUED] acetaminophen (TYLENOL 8 HOUR ARTHRITIS PAIN) 650 MG CR tablet Take 1 tablet (650 mg total) by mouth every 8 (eight) hours as needed for pain.   No facility-administered medications prior to visit.   Reviewed past medical and social history.   ROS per HPI above      Objective:  BP 138/72 (BP Location: Right Arm, Patient Position: Sitting, Cuff Size: Large)   Pulse 75   Temp 98.8 F (37.1 C) (Temporal)   Resp 18   Wt 204 lb (92.5 kg)   SpO2 100%   BMI 31.95 kg/m      Physical Exam Vitals and nursing note reviewed.  Cardiovascular:     Rate and Rhythm: Normal rate.  Pulses: Normal pulses.  Pulmonary:     Effort: Pulmonary effort is normal.  Neurological:     Mental Status: He is alert and oriented to person, place, and time.     No results found for any visits on 03/03/23.    Assessment & Plan:    Problem List Items Addressed This Visit     Bilateral hip dysplasia - Primary   Chronic hip pain, L>R, Rate pain as moderate, intermittent. No previous hip trauma He played baseball, basketball and football from middle school to college. Entered order for MRI w/o contrast. Check ESR, ANA, CRP, uric acid Entered referral to ortho Sent ibuprofen 600mg  BID prn Advised to take med with food and to minimize ALCOHOL consumption to  1drink daily       Relevant Medications   ibuprofen (ADVIL) 600 MG tablet   Other Relevant Orders   Sedimentation rate   ANA w/Reflex   C-reactive protein   Uric acid   Chronic left hip pain   Relevant Medications   ibuprofen (ADVIL) 600 MG tablet   Essential hypertension   BP at goal with med and diet compliance BP Readings from Last 3 Encounters:  03/03/23 138/72  02/15/23 (!) 190/100  06/15/22 120/84    Maintain med doses F/up in 3months      Hyperlipidemia associated with type 2 diabetes mellitus (HCC)   Relevant Orders   Lipid panel   Iron deficiency anemia   Repeat CBC and iron panel      Relevant Orders   CBC with Differential/Platelet   IBC + Ferritin   Other Visit Diagnoses       Immunization due       Relevant Orders   Flu Vaccine Trivalent High Dose (Fluad) (Completed)      Return in about 3 months (around 06/01/2023) for HTN, DM, hyperlipidemia (fasting).     Alysia Penna, NP

## 2023-03-03 NOTE — Assessment & Plan Note (Addendum)
Chronic hip pain, L>R, Rate pain as moderate, intermittent. No previous hip trauma He played baseball, basketball and football from middle school to college. Entered order for MRI w/o contrast. Check ESR, ANA, CRP, uric acid: normal Entered referral to ortho Sent ibuprofen 600mg  BID prn Advised to take med with food and to minimize ALCOHOL consumption to 1drink daily

## 2023-03-03 NOTE — Assessment & Plan Note (Signed)
BP at goal with med and diet compliance BP Readings from Last 3 Encounters:  03/03/23 138/72  02/15/23 (!) 190/100  06/15/22 120/84    Maintain med doses F/up in 3months

## 2023-03-03 NOTE — Addendum Note (Signed)
Addended by: Leroy Kennedy on: 03/03/2023 03:53 PM   Modules accepted: Orders

## 2023-03-03 NOTE — Patient Instructions (Addendum)
Go to lab Minimize ALCOHOL use,  maintain Heart healthy diet and daily exercise. Use ibuprofen 600mg  for pain, take with pain Start vit. D 5000IU daily Schedule appointment for DIABETES eye exam, MRI, and with ortho Schedule lab appointment to repeat lipid panel in 43month (fasting), ok to drink water and take BP meds.

## 2023-03-06 LAB — ANA W/REFLEX: Anti Nuclear Antibody (ANA): NEGATIVE

## 2023-03-06 MED ORDER — FOLIC ACID 1 MG PO TABS: 1.0000 mg | ORAL_TABLET | Freq: Every day | ORAL | Status: AC

## 2023-03-06 MED ORDER — VITAMIN B-12 1000 MCG PO TABS: 1000.0000 ug | ORAL_TABLET | Freq: Every day | ORAL | Status: DC

## 2023-03-06 NOTE — Addendum Note (Signed)
Addended by: Alysia Penna L on: 03/06/2023 02:44 PM   Modules accepted: Orders

## 2023-03-10 ENCOUNTER — Telehealth: Payer: Self-pay | Admitting: Nurse Practitioner

## 2023-03-10 NOTE — Telephone Encounter (Signed)
Advised to schedule appointment with [facility]. He was provided the phone number and provider's name: Annamarie Major, NP [phone]. He verbalized understanding

## 2023-03-27 ENCOUNTER — Encounter: Payer: Self-pay | Admitting: Nurse Practitioner

## 2023-03-28 ENCOUNTER — Ambulatory Visit: Payer: BC Managed Care – PPO | Admitting: Orthopaedic Surgery

## 2023-03-29 ENCOUNTER — Ambulatory Visit: Payer: 59 | Admitting: Surgical

## 2023-04-01 ENCOUNTER — Other Ambulatory Visit: Payer: BC Managed Care – PPO

## 2023-04-09 ENCOUNTER — Other Ambulatory Visit: Payer: BC Managed Care – PPO

## 2023-04-11 ENCOUNTER — Ambulatory Visit: Payer: 59 | Admitting: Orthopaedic Surgery

## 2023-04-14 ENCOUNTER — Ambulatory Visit: Payer: 59 | Admitting: Orthopaedic Surgery

## 2023-04-21 ENCOUNTER — Other Ambulatory Visit: Payer: Self-pay | Admitting: Nurse Practitioner

## 2023-04-21 DIAGNOSIS — N521 Erectile dysfunction due to diseases classified elsewhere: Secondary | ICD-10-CM

## 2023-05-01 NOTE — Progress Notes (Signed)
Office Visit Note   Patient: Corey Mcguire           Date of Birth: 1961/01/12           MRN: [id number] Visit Date: 05/02/2023              Requested by: Anne Ng, NP [address] [place],  [place] [address] PCP: Anne Ng, NP   Assessment & Plan: Visit Diagnoses:  1. Avascular necrosis of bone of hip, left [facility])     Plan: Corey Mcguire is a very pleasant 63 year old gentleman with left greater than right avascular necrosis.  Left femoral head is showing early collapse and flattening with cystic changes.  He reports that the pain is not constant and not preventing him from sleeping.  At this point he will continue with conservative and symptomatic treatment.  Handout for total hip provided.  He has my card if he has any issues.  Follow-Up Instructions: No follow-ups on file.   Orders:  No orders of the defined types were placed in this encounter.  No orders of the defined types were placed in this encounter.     Procedures: No procedures performed   Clinical Data: No additional findings.   Subjective: No chief complaint on file.   HPI Corey Mcguire is a 63 year old gentleman here for evaluation of bilateral hip and groin pain worse on the left hip.  Reports that he drinks about 2 shots of liquor every other night.  He works with the school system.  Has chronic stage III kidney disease.  Review of Systems  Constitutional: Negative.   HENT: Negative.    Eyes: Negative.   Respiratory: Negative.    Cardiovascular: Negative.   Gastrointestinal: Negative.   Endocrine: Negative.   Genitourinary: Negative.   Skin: Negative.   Allergic/Immunologic: Negative.   Neurological: Negative.   Hematological: Negative.   Psychiatric/Behavioral: Negative.    All other systems reviewed and are negative.    Objective: Vital Signs: There were no vitals taken for this visit.  Physical Exam Vitals and nursing note reviewed.  Constitutional:       Appearance: He is well-developed.  HENT:     Head: Normocephalic and atraumatic.  Eyes:     Pupils: Pupils are equal, round, and reactive to light.  Pulmonary:     Effort: Pulmonary effort is normal.  Abdominal:     Palpations: Abdomen is soft.  Musculoskeletal:        General: Normal range of motion.     Cervical back: Neck supple.  Skin:    General: Skin is warm.  Neurological:     Mental Status: He is alert and oriented to person, place, and time.  Psychiatric:        Behavior: Behavior normal.        Thought Content: Thought content normal.        Judgment: Judgment normal.     Ortho Exam Examination of the hip show adequate range of motion.  Normal gait pattern.  No significant pain with range of motion. Specialty Comments:  No specialty comments available.  Imaging: No results found.   PMFS History: Patient Active Problem List   Diagnosis Date Noted   Bilateral hip dysplasia 03/02/2023   Chronic left hip pain 02/15/2023   Alcoholic liver disease (HCC) 06/16/2022   Elevated liver transaminase level 06/15/2022   Smokeless tobacco use 01/28/2022   Primary insomnia 01/28/2022   Aortic atherosclerosis (HCC) 12/15/2021   Inguinal  hernia 12/15/2021   History of colon polyps 12/15/2021   Stage 3 chronic kidney disease due to diabetes mellitus (HCC) 09/23/2020   Erectile dysfunction 09/23/2020   Fatty liver 09/23/2020   GERD with esophagitis 09/23/2020   Gout 09/23/2020   Macrocytic anemia 09/23/2020   Localized, primary osteoarthritis of shoulder region 09/23/2020   Type 2 diabetes mellitus with other specified complication (HCC) 09/23/2020   Vitamin D deficiency 11/12/2018   Essential hypertension 01/05/2016   Hyperlipidemia associated with type 2 diabetes mellitus (HCC) 10/02/2014   Past Medical History:  Diagnosis Date   Anxiety    History of panic attacks 2016--improved with employment   Hyperlipidemia 10/02/2014   Labs in [facility]:  Total:  330,  Trigs:  109, HDL:  89,  LDL:  219.  did not follow up afterward   Hypertension    Libido, decreased 12/26/2018   Prediabetes 10/02/2014   A1C:  5.8%  did not follow up    Family History  Problem Relation Age of Onset   Hypertension Mother    Diabetes Father    Heart disease Father        Quadruple bypass    Past Surgical History:  Procedure Laterality Date   KNEE SURGERY     SHOULDER SURGERY     Social History   Occupational History   Occupation: Magazine features editor: [employer]    Comment: Kindergarten Runner, broadcasting/film/video at [employer]  Tobacco Use   Smoking status: Never   Smokeless tobacco: Current    Types: Chew   Tobacco comments:    Chewed tobacco since age 42 yo  Substance and Sexual Activity   Alcohol use: Yes    Alcohol/week: 8.0 standard drinks of alcohol    Types: 8 Shots of liquor per week   Drug use: No   Sexual activity: Yes    Birth control/protection: None

## 2023-05-02 ENCOUNTER — Ambulatory Visit: Payer: 59 | Admitting: Orthopaedic Surgery

## 2023-05-02 DIAGNOSIS — M87052 Idiopathic aseptic necrosis of left femur: Secondary | ICD-10-CM

## 2023-05-12 ENCOUNTER — Other Ambulatory Visit: Payer: Self-pay | Admitting: Nurse Practitioner

## 2023-05-12 DIAGNOSIS — K2100 Gastro-esophageal reflux disease with esophagitis, without bleeding: Secondary | ICD-10-CM

## 2023-06-01 ENCOUNTER — Ambulatory Visit: Payer: Self-pay | Admitting: Nurse Practitioner

## 2023-06-20 ENCOUNTER — Other Ambulatory Visit: Payer: Self-pay | Admitting: Nurse Practitioner

## 2023-06-20 DIAGNOSIS — N521 Erectile dysfunction due to diseases classified elsewhere: Secondary | ICD-10-CM

## 2023-06-21 ENCOUNTER — Other Ambulatory Visit: Payer: Self-pay | Admitting: Nurse Practitioner

## 2023-06-22 NOTE — Telephone Encounter (Signed)
 Medication: Sildenafil (Viagra) 50 mg  Directions: Take 1 tablet by mouth as needed erectile dysfunction Last given: 04/25/23 Number refills: 1 Last o/v: 03/03/23 Follow up: 3 months (around 06/01/23)-06/27/23 Labs: 03/03/23

## 2023-06-22 NOTE — Telephone Encounter (Signed)
 Called patient and informed him of my call. I asked if this was an automatic refill request and/or if he called into pharmacy to request. Patient stated that he needed it. I thanked him for taking my call.

## 2023-06-27 ENCOUNTER — Ambulatory Visit: Payer: Self-pay | Admitting: Nurse Practitioner

## 2023-08-08 ENCOUNTER — Other Ambulatory Visit: Payer: Self-pay | Admitting: Nurse Practitioner

## 2023-08-08 DIAGNOSIS — K2100 Gastro-esophageal reflux disease with esophagitis, without bleeding: Secondary | ICD-10-CM

## 2023-08-08 DIAGNOSIS — E1169 Type 2 diabetes mellitus with other specified complication: Secondary | ICD-10-CM

## 2023-08-08 DIAGNOSIS — I10 Essential (primary) hypertension: Secondary | ICD-10-CM

## 2023-08-08 NOTE — Telephone Encounter (Signed)
 Called and left a voice message for the patient per DPR on file asking to give me a call back at the office. I will try calling again.

## 2023-08-08 NOTE — Telephone Encounter (Signed)
 Medication: Amlodipine  10 mg  Directions: Take 1 tablet by mouth at bedtime  Last given: 02/15/23 Number refills: 1 Last o/v: 03/03/23 Follow up: 03/03/23 Labs: 3 month f/u-Canceled 03/13 and 04/08 appointments   Medication: Fenofibrate  145 mg  Directions: Take 1 tablet by mouth daily  Last given: 02/15/23 Number refills: 1 Last o/v: 03/03/23 Follow up: 3 month f/u-Canceled 03/13 and 04/08 appointments  Labs: 03/03/23  Medication: Omeprazole  20 mg  Directions: Take 1 tablet by mouth daily every morning  Last given: 05/12/23 Number refills: 0 Last o/v: 03/03/23 Follow up: 3 months-Canceled 03/13 and 04/08 appointments  Labs: 03/03/23  Medication: Rosuvastatin  20 mg  Directions: Take 1 tablet by mouth daily  Last given: 02/15/23 Number refills: 1 Last o/v: 03/03/23 Follow up: 3 months-Canceled 03/13 and 04/08 appointments   Labs: 03/03/23  Medication: Valsartan  160 mg  Directions: Take 1 tablet by mouth daily  Last given: 02/15/23 Number refills: 1 Last o/v: 03/03/23 Follow up: 3 months-Canceled 03/13 and 04/08 appointments  Labs: 03/03/23

## 2023-08-23 ENCOUNTER — Other Ambulatory Visit: Payer: Self-pay | Admitting: Nurse Practitioner

## 2023-08-23 DIAGNOSIS — I10 Essential (primary) hypertension: Secondary | ICD-10-CM

## 2023-08-25 NOTE — Telephone Encounter (Signed)
 Called and left a detailed voice message per DPR on file for patient detailing that his refills requests that have been coming into the office have been refilled as 30 day with no refills and that he needs an office visit with Soyla Duverney for his 90 day supply with refills can continue. Asked to give the office a call at [phone] to get scheduled for an appointment and if he has any questions for me. I will try calling patient again.

## 2023-08-25 NOTE — Telephone Encounter (Signed)
 Medication: Valsartan  160 mg  Directions: Take 1 tablet by mouth daily  Last given: 08/08/23 Number refills: 0 Last o/v: 03/03/23 Follow up: 3 months-Canceled 03/13 and 04/08 appointments  Labs: 03/03/23

## 2023-08-26 ENCOUNTER — Other Ambulatory Visit: Payer: Self-pay | Admitting: Nurse Practitioner

## 2023-08-26 DIAGNOSIS — N521 Erectile dysfunction due to diseases classified elsewhere: Secondary | ICD-10-CM

## 2023-08-28 NOTE — Telephone Encounter (Signed)
 Medication: Sildenafil  50 mg  Directions: Take 1 tablet by mouth as needed for  erectile dysfunction  Last given: 06/22/23 Number refills: 1 Last o/v: 03/03/23 Follow up: 6 months-Canceled 06/01/23, 06/27/23 appts Labs: 03/03/23

## 2023-09-09 ENCOUNTER — Other Ambulatory Visit: Payer: Self-pay | Admitting: Nurse Practitioner

## 2023-09-09 DIAGNOSIS — E1169 Type 2 diabetes mellitus with other specified complication: Secondary | ICD-10-CM

## 2023-09-09 DIAGNOSIS — I10 Essential (primary) hypertension: Secondary | ICD-10-CM

## 2023-09-09 DIAGNOSIS — K2100 Gastro-esophageal reflux disease with esophagitis, without bleeding: Secondary | ICD-10-CM

## 2023-09-12 NOTE — Telephone Encounter (Signed)
 Called patient and informed him reason for my call. He was informed that our office has made multiple attempts to get him scheduled for an appointment and that his health needs are a priority here at [employer]. 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 I got patient scheduled for an office visit with Roselie for 09/25/23 at 11:00 AM. Patient was informed that he must keep upcoming 09/25/23 appointment for continuation of #90 with refills. He thanked me for calling and all (if any) questions were answered.

## 2023-09-12 NOTE — Telephone Encounter (Signed)
 Medication: Amlodipine  (Norvasc ) 10 mg  Directions: Take 1 tablet by mouth daily  Last given: 08/08/23 Number refills: 0 Last o/v: 03/03/23 Follow up: 3 months (around 06/01/23) Labs: 3 months (around 06/01/23)  Medication: Fenofibrate  (Tricor ) 145 mg  Directions: Take 1 tablet by mouth daily  Last given: 08/08/23 Number refills: 0 Last o/v: 03/03/23 Follow up: 3 months (around 06/01/23) Labs: 3 months (around 06/01/23)  Medication: Omeprazole  (Prilosec) 20 mg  Directions: Take 1 tablet by mouth every morning  Last given: 07/2023 Number refills: 0 Last o/v: 03/03/23 Follow up: 3 months (around 06/01/23) Labs: 03/03/23  Medication: Rosuvastatin  (Crestor ) 20 mg  Directions: Take 1 tablet by mouth daily  Last given: 08/08/23 Number refills: 0 Last o/v: 03/03/23 Follow up: 3 months (around 06/01/23) Labs: 03/03/23

## 2023-09-25 ENCOUNTER — Encounter: Payer: Self-pay | Admitting: Nurse Practitioner

## 2023-09-25 ENCOUNTER — Ambulatory Visit: Admitting: Nurse Practitioner

## 2023-09-25 VITALS — BP 160/88 | HR 81 | Temp 98.7°F | Ht 67.0 in | Wt 216.4 lb

## 2023-09-25 DIAGNOSIS — Z794 Long term (current) use of insulin: Secondary | ICD-10-CM

## 2023-09-25 DIAGNOSIS — E559 Vitamin D deficiency, unspecified: Secondary | ICD-10-CM

## 2023-09-25 DIAGNOSIS — E1169 Type 2 diabetes mellitus with other specified complication: Secondary | ICD-10-CM

## 2023-09-25 DIAGNOSIS — E785 Hyperlipidemia, unspecified: Secondary | ICD-10-CM

## 2023-09-25 DIAGNOSIS — N183 Chronic kidney disease, stage 3 unspecified: Secondary | ICD-10-CM

## 2023-09-25 DIAGNOSIS — Z23 Encounter for immunization: Secondary | ICD-10-CM

## 2023-09-25 DIAGNOSIS — D539 Nutritional anemia, unspecified: Secondary | ICD-10-CM | POA: Diagnosis not present

## 2023-09-25 DIAGNOSIS — Q6589 Other specified congenital deformities of hip: Secondary | ICD-10-CM

## 2023-09-25 DIAGNOSIS — I10 Essential (primary) hypertension: Secondary | ICD-10-CM | POA: Diagnosis not present

## 2023-09-25 DIAGNOSIS — K709 Alcoholic liver disease, unspecified: Secondary | ICD-10-CM

## 2023-09-25 DIAGNOSIS — I700 Atherosclerosis of aorta: Secondary | ICD-10-CM

## 2023-09-25 LAB — COMPREHENSIVE METABOLIC PANEL WITH GFR
ALT: 23 U/L (ref 0–53)
AST: 46 U/L — ABNORMAL HIGH (ref 0–37)
Albumin: 4.4 g/dL (ref 3.5–5.2)
Alkaline Phosphatase: 45 U/L (ref 39–117)
BUN: 18 mg/dL (ref 6–23)
CO2: 27 meq/L (ref 19–32)
Calcium: 9.2 mg/dL (ref 8.4–10.5)
Chloride: 99 meq/L (ref 96–112)
Creatinine, Ser: 1.44 mg/dL (ref 0.40–1.50)
GFR: 52.8 mL/min — ABNORMAL LOW (ref >60.00)
Glucose, Bld: 119 mg/dL — ABNORMAL HIGH (ref 70–99)
Potassium: 3 meq/L — ABNORMAL LOW (ref 3.5–5.1)
Sodium: 137 meq/L (ref 135–145)
Total Bilirubin: 0.7 mg/dL (ref 0.2–1.2)
Total Protein: 7.8 g/dL (ref 6.0–8.3)

## 2023-09-25 LAB — LIPID PANEL
Cholesterol: 174 mg/dL (ref 0–200)
HDL: 82.6 mg/dL (ref >39.00)
LDL Cholesterol: 72 mg/dL (ref 0–99)
NonHDL: 91.39
Total CHOL/HDL Ratio: 2
Triglycerides: 99 mg/dL (ref 0.0–149.0)
VLDL: 19.8 mg/dL (ref 0.0–40.0)

## 2023-09-25 LAB — HEMOGLOBIN A1C: Hgb A1c MFr Bld: 6.6 % — ABNORMAL HIGH (ref 4.6–6.5)

## 2023-09-25 LAB — B12 AND FOLATE PANEL
Folate: 6.7 ng/mL (ref >5.9)
Vitamin B-12: 269 pg/mL (ref 211–911)

## 2023-09-25 LAB — VITAMIN D 25 HYDROXY (VIT D DEFICIENCY, FRACTURES): VITD: 24.44 ng/mL — ABNORMAL LOW (ref 30.00–100.00)

## 2023-09-25 MED ORDER — IBUPROFEN 600 MG PO TABS: 600.0000 mg | ORAL_TABLET | Freq: Every day | ORAL | 0 refills | Status: DC

## 2023-09-25 MED ORDER — DEXCOM G7 RECEIVER DEVI: 0 refills | Status: DC

## 2023-09-25 MED ORDER — DEXCOM G7 SENSOR MISC: 11 refills | Status: DC

## 2023-09-25 NOTE — Assessment & Plan Note (Addendum)
 Secondary to ETOH Repeat B12 and folate

## 2023-09-25 NOTE — Patient Instructions (Signed)
 Go to lab Maintain Heart healthy diet and daily exercise. Maintain current medications. Schedule appointment with liver specialist.

## 2023-09-25 NOTE — Assessment & Plan Note (Signed)
 Repeat vit D

## 2023-09-25 NOTE — Assessment & Plan Note (Signed)
 maintain crestor , start fenofibrate . Advised to maintain heart healthy diet and stop ALCOHOL intake, avoid nicotine use Repeat lipid panel and CMP

## 2023-09-25 NOTE — Assessment & Plan Note (Signed)
 Advised to schedule appointment with liver specialist as soon as possible. Repeat CMP

## 2023-09-25 NOTE — Assessment & Plan Note (Signed)
 BP not at goal Reports increased anxiety with Dr. Visits Reports compliance with DASH diet and med compliance (amlodipine  10mg  and valsartan  160mg ) BP Readings from Last 3 Encounters:  09/25/23 (!) 160/88  03/03/23 138/72  02/15/23 (!) 190/100    Maintain med doses Repeat CMP (consider adding jardiance or increase valsartan  dose)? F/up in 1months

## 2023-09-25 NOTE — Assessment & Plan Note (Addendum)
 Had appointment with ortho. Hip surgery recommended, but he opted to wait. He feels overwhelm with current health diagnosis, concerned about possible surgical complication. States hip pain is controlled with ibuprofen  600mg  once a day with food. Refill sent We discussed need for counseling, but he declined

## 2023-09-25 NOTE — Progress Notes (Signed)
 Established Patient Visit  Patient: Corey Mcguire   DOB: [birth year]   63 y.o. Male  MRN: [id number] Visit Date: 09/25/2023  Subjective:    Chief Complaint  Patient presents with   Follow-up    (FASTING) 3 month follow up  Refills needed    HPI Alcoholic liver disease (HCC) Advised to schedule appointment with liver specialist as soon as possible. Repeat CMP  Essential hypertension BP not at goal Reports increased anxiety with Dr. Visits Reports compliance with DASH diet and med compliance (amlodipine  10mg  and valsartan  160mg ) BP Readings from Last 3 Encounters:  09/25/23 (!) 160/88  03/03/23 138/72  02/15/23 (!) 190/100    Maintain med doses Repeat CMP (consider adding jardiance or increase valsartan  dose)? F/up in 1months  DM (diabetes mellitus) (HCC) Repeat hgbA1c, UACr, CMP, and lipid panel Consider adding jardiance if normal renal function F/up in 3months  Hyperlipidemia associated with type 2 diabetes mellitus (HCC) maintain crestor , start fenofibrate . Advised to maintain heart healthy diet and stop ALCOHOL intake, avoid nicotine use Repeat lipid panel and CMP   Bilateral hip dysplasia Had appointment with ortho. Hip surgery recommended, but he opted to wait. He feels overwhelm with current health diagnosis, concerned about possible surgical complication. States hip pain is controlled with ibuprofen  600mg  once a day with food. Refill sent We discussed need for counseling, but he declined  Vitamin D  deficiency Repeat vit D  Macrocytic anemia Secondary to ETOH Repeat B12 and folate  Reviewed medical, surgical, and social history today  Medications: Outpatient Medications Prior to Visit  Medication Sig   Accu-Chek Softclix Lancets lancets SMARTSIG:Topical   amLODipine  (NORVASC ) 10 MG tablet TAKE 1 TABLET BY MOUTH AT BEDTIME   Blood Glucose Monitoring Suppl DEVI 1 each by Does not apply route in the morning, at noon, and at bedtime. May  substitute to any manufacturer covered by patient's insurance.   famotidine  (PEPCID ) 20 MG tablet TAKE 1 TABLET BY MOUTH AT BEDTIME   fenofibrate  (TRICOR ) 145 MG tablet TAKE 1 TABLET BY MOUTH DAILY   folic acid  (FOLVITE ) 1 MG tablet Take 1 tablet (1 mg total) by mouth daily.   Glucose Blood (BLOOD GLUCOSE TEST STRIPS) STRP Inject 1 each into the skin daily before breakfast. May substitute to any manufacturer covered by patient's insurance.   Lancets Misc. MISC 1 each by Does not apply route daily before breakfast. May substitute to any manufacturer covered by patient's insurance.   omeprazole  (PRILOSEC) 20 MG capsule TAKE 1 CAPSULE BY MOUTH EVERY MORNING   rosuvastatin  (CRESTOR ) 20 MG tablet TAKE 1 TABLET BY MOUTH DAILY   sildenafil  (VIAGRA ) 50 MG tablet TAKE 1 TABLET BY MOUTH AS NEEDED FOR ERECTILE DYSFUNCTION   valsartan  (DIOVAN ) 160 MG tablet Take 1 tablet (160 mg total) by mouth daily. No additional refill without appointment with pcp   Cholecalciferol (VITAMIN D3) 125 MCG (5000 UT) CAPS Take 1 capsule (5,000 Units total) by mouth daily. (Patient not taking: Reported on 09/25/2023)   cyanocobalamin  (VITAMIN B12) 1000 MCG tablet Take 1 tablet (1,000 mcg total) by mouth daily. (Patient not taking: Reported on 09/25/2023)   Lancet Device MISC 1 each by Does not apply route daily. May substitute to any manufacturer covered by patient's insurance.   potassium chloride  SA (KLOR-CON  M) 20 MEQ tablet Take 2 tablets (40 mEq total) by mouth daily.   No facility-administered medications prior to visit.   Reviewed past  medical and social history.   ROS per HPI above      Objective:  BP (!) 160/88 (BP Location: Left Arm, Patient Position: Sitting, Cuff Size: Large)   Pulse 81   Temp 98.7 F (37.1 C) (Oral)   Ht 5' 7 (1.702 m)   Wt 216 lb 6.4 oz (98.2 kg)   SpO2 99%   BMI 33.89 kg/m      Physical Exam Cardiovascular:     Rate and Rhythm: Normal rate and regular rhythm.     Pulses: Normal  pulses.     Heart sounds: Normal heart sounds.  Pulmonary:     Effort: Pulmonary effort is normal.     Breath sounds: Normal breath sounds.  Musculoskeletal:     Right lower leg: No edema.     Left lower leg: No edema.  Neurological:     Mental Status: He is alert and oriented to person, place, and time.     No results found for any visits on 09/25/23.    Assessment & Plan:    Problem List Items Addressed This Visit     Alcoholic liver disease (HCC)   Advised to schedule appointment with liver specialist as soon as possible. Repeat CMP      Aortic atherosclerosis (HCC)   Bilateral hip dysplasia   Had appointment with ortho. Hip surgery recommended, but he opted to wait. He feels overwhelm with current health diagnosis, concerned about possible surgical complication. States hip pain is controlled with ibuprofen  600mg  once a day with food. Refill sent We discussed need for counseling, but he declined      Relevant Medications   ibuprofen  (ADVIL ) 600 MG tablet   DM (diabetes mellitus) (HCC)   Repeat hgbA1c, UACr, CMP, and lipid panel Consider adding jardiance if normal renal function F/up in 3months      Relevant Medications   Continuous Glucose Receiver (DEXCOM G7 RECEIVER) DEVI   Continuous Glucose Sensor (DEXCOM G7 SENSOR) MISC   Other Relevant Orders   Hemoglobin A1c   Microalbumin / creatinine urine ratio   Comprehensive metabolic panel with GFR   Essential hypertension - Primary   BP not at goal Reports increased anxiety with Dr. Visits Reports compliance with DASH diet and med compliance (amlodipine  10mg  and valsartan  160mg ) BP Readings from Last 3 Encounters:  09/25/23 (!) 160/88  03/03/23 138/72  02/15/23 (!) 190/100    Maintain med doses Repeat CMP (consider adding jardiance or increase valsartan  dose)? F/up in 1months      Relevant Orders   Comprehensive metabolic panel with GFR   Hyperlipidemia associated with type 2 diabetes mellitus (HCC)    maintain crestor , start fenofibrate . Advised to maintain heart healthy diet and stop ALCOHOL intake, avoid nicotine use Repeat lipid panel and CMP       Relevant Orders   Lipid panel   Macrocytic anemia   Secondary to ETOH Repeat B12 and folate      Relevant Orders   B12 and Folate Panel   Stage 3 chronic kidney disease due to diabetes mellitus (HCC)   Vitamin D  deficiency   Repeat vit D      Relevant Orders   VITAMIN D  25 Hydroxy (Vit-D Deficiency, Fractures)   Other Visit Diagnoses       Immunization due       Relevant Orders   Pneumococcal conjugate vaccine 20-valent (Prevnar 20) (Completed)      Return in about 4 weeks (around 10/23/2023) for HTN.  Roselie Mood, NP

## 2023-09-25 NOTE — Assessment & Plan Note (Signed)
 Repeat hgbA1c, UACr, CMP, and lipid panel Consider adding jardiance if normal renal function F/up in 3months

## 2023-09-26 ENCOUNTER — Ambulatory Visit: Payer: Self-pay | Admitting: Nurse Practitioner

## 2023-09-26 DIAGNOSIS — E1169 Type 2 diabetes mellitus with other specified complication: Secondary | ICD-10-CM

## 2023-09-26 DIAGNOSIS — I10 Essential (primary) hypertension: Secondary | ICD-10-CM

## 2023-09-26 DIAGNOSIS — E876 Hypokalemia: Secondary | ICD-10-CM

## 2023-09-26 MED ORDER — POTASSIUM CHLORIDE CRYS ER 20 MEQ PO TBCR: 20.0000 meq | EXTENDED_RELEASE_TABLET | Freq: Two times a day (BID) | ORAL | 0 refills | Status: DC

## 2023-09-26 MED ORDER — ROSUVASTATIN CALCIUM 20 MG PO TABS: 20.0000 mg | ORAL_TABLET | Freq: Every day | ORAL | 1 refills | Status: DC

## 2023-09-26 MED ORDER — METFORMIN HCL ER 500 MG PO TB24: 500.0000 mg | ORAL_TABLET | Freq: Every day | ORAL | 1 refills | Status: DC

## 2023-09-26 MED ORDER — AMLODIPINE BESYLATE 10 MG PO TABS: 10.0000 mg | ORAL_TABLET | Freq: Every day | ORAL | 1 refills | Status: DC

## 2023-09-26 MED ORDER — VALSARTAN 160 MG PO TABS
160.0000 mg | ORAL_TABLET | Freq: Every day | ORAL | 1 refills | Status: DC
Start: 2023-09-26 — End: 2023-10-09

## 2023-09-26 MED ORDER — FENOFIBRATE 145 MG PO TABS: 145.0000 mg | ORAL_TABLET | Freq: Every day | ORAL | 1 refills | Status: DC

## 2023-09-27 ENCOUNTER — Other Ambulatory Visit: Payer: Self-pay | Admitting: Nurse Practitioner

## 2023-09-27 DIAGNOSIS — N521 Erectile dysfunction due to diseases classified elsewhere: Secondary | ICD-10-CM

## 2023-09-28 ENCOUNTER — Telehealth: Payer: Self-pay

## 2023-09-28 ENCOUNTER — Other Ambulatory Visit (HOSPITAL_COMMUNITY): Payer: Self-pay

## 2023-09-28 NOTE — Telephone Encounter (Signed)
 Pharmacy Patient Advocate Encounter   Received notification from Onbase that prior authorization for Dexcom G7 Receiver device  is required/requested.   Insurance verification completed.   The patient is insured through [facility] .   Per test claim: PA required; PA submitted to above mentioned insurance via CoverMyMeds Key/confirmation #/EOC [id number] Status is pending

## 2023-10-02 NOTE — Telephone Encounter (Signed)
 Pharmacy Patient Advocate Encounter  Received notification from [facility] that Prior Authorization for Dexcom G7 Receiver device  has been DENIED.  See denial reason below. No denial letter attached in CMM. Will attach denial letter to Media tab once received.   PA #/Case ID/Reference #: [id number]

## 2023-10-03 NOTE — Telephone Encounter (Signed)
 Called patient and informed him of the denial for the [facility] G7 sensor and receiver. Asked patient if he would like a prescription glucometer for finger sticks once daily before breakfast. Patient stated no thanks he has one already. I thanked him for taking my called and ask if he had any questions. Verbalized no and thanked me for calling.

## 2023-10-09 ENCOUNTER — Other Ambulatory Visit: Payer: Self-pay | Admitting: Nurse Practitioner

## 2023-10-09 DIAGNOSIS — K2100 Gastro-esophageal reflux disease with esophagitis, without bleeding: Secondary | ICD-10-CM

## 2023-10-09 DIAGNOSIS — I10 Essential (primary) hypertension: Secondary | ICD-10-CM

## 2023-10-09 NOTE — Telephone Encounter (Signed)
 Called patient's pharmacy and spoke with Jenkins. I informed her of my call. I asked if they received the refill from [facility] on 09/25/23 for the Valsartan  160 mg #90 with 1 refill. She stated that they do not have that on file that the last refill is from 08/26/23 No additional refill without appointment with pcp, Starting Mon 08/28/2023, Until Tue 09/26/2023. I informed that I will re-send the Rx request. She thanked me for calling

## 2023-10-23 ENCOUNTER — Telehealth: Payer: Self-pay

## 2023-10-23 NOTE — Telephone Encounter (Signed)
 Called patient and informed him of Charlotte's comments. Patient verbalized understanding and all (if any) questions were answered.

## 2023-10-23 NOTE — Telephone Encounter (Signed)
 Copied from CRM [id number]. Topic: Clinical - Medication Question >> Oct 20, 2023  4:20 PM Corey Mcguire wrote: Reason for CRM: Patient called in stating that he has a appointment scheduled with provider on 11/03/2023. He would like to know if he could just have a few of pills sildenafil  (VIAGRA ) 50 MG tablet filled for him until then.Patient stated that this is effecting his relationship with his wife.  [facility] [id number] [place], [place] - [address]  Phone: [phone] Fax: [phone]

## 2023-11-03 ENCOUNTER — Ambulatory Visit: Admitting: Nurse Practitioner

## 2023-11-03 ENCOUNTER — Encounter: Payer: Self-pay | Admitting: Nurse Practitioner

## 2023-11-03 DIAGNOSIS — I10 Essential (primary) hypertension: Secondary | ICD-10-CM

## 2023-11-03 MED ORDER — VALSARTAN 320 MG PO TABS: 320.0000 mg | ORAL_TABLET | Freq: Every day | ORAL | 1 refills | Status: AC

## 2023-11-03 NOTE — Patient Instructions (Addendum)
 Increase valsartan  dose to 320mg  daily Maintain other med doses Monitor BP daily in Am and record F/up in 2weeks (video appointment)  DASH Eating Plan DASH stands for Dietary Approaches to Stop Hypertension. The DASH eating plan is a healthy eating plan that has been shown to: Lower high blood pressure (hypertension). Reduce your risk for type 2 diabetes, heart disease, and stroke. Help with weight loss. What are tips for following this plan? Reading food labels Check food labels for the amount of salt (sodium) per serving. Choose foods with less than 5 percent of the Daily Value (DV) of sodium. In general, foods with less than 300 milligrams (mg) of sodium per serving fit into this eating plan. To find whole grains, look for the word whole as the first word in the ingredient list. Shopping Buy products labeled as low-sodium or no salt added. Buy fresh foods. Avoid canned foods and pre-made or frozen meals. Cooking Try not to add salt when you cook. Use salt-free seasonings or herbs instead of table salt or sea salt. Check with your health care provider or pharmacist before using salt substitutes. Do not fry foods. Cook foods in healthy ways, such as baking, boiling, grilling, roasting, or broiling. Cook using oils that are good for your heart. These include olive, canola, avocado, soybean, and sunflower oil. Meal planning  Eat a balanced diet. This should include: 4 or more servings of fruits and 4 or more servings of vegetables each day. Try to fill half of your plate with fruits and vegetables. 6-8 servings of whole grains each day. 6 or less servings of lean meat, poultry, or fish each day. 1 oz is 1 serving. A 3 oz (85 g) serving of meat is about the same size as the palm of your hand. One egg is 1 oz (28 g). 2-3 servings of low-fat dairy each day. One serving is 1 cup (237 mL). 1 serving of nuts, seeds, or beans 5 times each week. 2-3 servings of heart-healthy fats. Healthy  fats called omega-3 fatty acids are found in foods such as walnuts, flaxseeds, fortified milks, and eggs. These fats are also found in cold-water fish, such as sardines, salmon, and mackerel. Limit how much you eat of: Canned or prepackaged foods. Food that is high in trans fat, such as fried foods. Food that is high in saturated fat, such as fatty meat. Desserts and other sweets, sugary drinks, and other foods with added sugar. Full-fat dairy products. Do not salt foods before eating. Do not eat more than 4 egg yolks a week. Try to eat at least 2 vegetarian meals a week. Eat more home-cooked food and less restaurant, buffet, and fast food. Lifestyle When eating at a restaurant, ask if your food can be made with less salt or no salt. If you drink alcohol: Limit how much you have to: 0-1 drink a day if you are male. 0-2 drinks a day if you are male. Know how much alcohol is in your drink. In the [place] one drink is one 12 oz bottle of beer (355 mL), one 5 oz glass of wine (148 mL), or one 1 oz glass of hard liquor (44 mL). General information Avoid eating more than 2,300 mg of salt a day. If you have hypertension, you may need to reduce your sodium intake to 1,500 mg a day. Work with your provider to stay at a healthy body weight or lose weight. Ask what the best weight range is for you. On most days  of the week, get at least 30 minutes of exercise that causes your heart to beat faster. This may include walking, swimming, or biking. Work with your provider or dietitian to adjust your eating plan to meet your specific calorie needs. What foods should I eat? Fruits All fresh, dried, or frozen fruit. Canned fruits that are in their natural juice and do not have sugar added to them. Vegetables Fresh or frozen vegetables that are raw, steamed, roasted, or grilled. Low-sodium or reduced-sodium tomato and vegetable juice. Low-sodium or reduced-sodium tomato sauce and tomato paste. Low-sodium or  reduced-sodium canned vegetables. Grains Whole-grain or whole-wheat bread. Whole-grain or whole-wheat pasta. Brown rice. [place]neil Madeira. Bulgur. Whole-grain and low-sodium cereals. Pita bread. Low-fat, low-sodium crackers. Whole-wheat flour tortillas. Meats and other proteins Skinless chicken or [place]. Ground chicken or [place]. Pork with fat trimmed off. Fish and seafood. Egg whites. Dried beans, peas, or lentils. Unsalted nuts, nut butters, and seeds. Unsalted canned beans. Lean cuts of beef with fat trimmed off. Low-sodium, lean precooked or cured meat, such as sausages or meat loaves. Dairy Low-fat (1%) or fat-free (skim) milk. Reduced-fat, low-fat, or fat-free cheeses. Nonfat, low-sodium ricotta or cottage cheese. Low-fat or nonfat yogurt. Low-fat, low-sodium cheese. Fats and oils Soft margarine without trans fats. Vegetable oil. Reduced-fat, low-fat, or light mayonnaise and salad dressings (reduced-sodium). Canola, safflower, olive, avocado, soybean, and sunflower oils. Avocado. Seasonings and condiments Herbs. Spices. Seasoning mixes without salt. Other foods Unsalted popcorn and pretzels. Fat-free sweets. The items listed above may not be all the foods and drinks you can have. Talk to a dietitian to learn more. What foods should I avoid? Fruits Canned fruit in a light or heavy syrup. Fried fruit. Fruit in cream or butter sauce. Vegetables Creamed or fried vegetables. Vegetables in a cheese sauce. Regular canned vegetables that are not marked as low-sodium or reduced-sodium. Regular canned tomato sauce and paste that are not marked as low-sodium or reduced-sodium. Regular tomato and vegetable juices that are not marked as low-sodium or reduced-sodium. Dene. Olives. Grains Baked goods made with fat, such as croissants, muffins, or some breads. Dry pasta or rice meal packs. Meats and other proteins Fatty cuts of meat. Ribs. Fried meat. [place]. Bologna, salami, and other precooked or  cured meats, such as sausages or meat loaves, that are not lean and low in sodium. Fat from the back of a pig (fatback). Bratwurst. Salted nuts and seeds. Canned beans with added salt. Canned or smoked fish. Whole eggs or egg yolks. Chicken or [place] with skin. Dairy Whole or 2% milk, cream, and half-and-half. Whole or full-fat cream cheese. Whole-fat or sweetened yogurt. Full-fat cheese. Nondairy creamers. Whipped toppings. Processed cheese and cheese spreads. Fats and oils Butter. Stick margarine. Lard. Shortening. Ghee. Bacon fat. Tropical oils, such as coconut, palm kernel, or palm oil. Seasonings and condiments Onion salt, garlic salt, seasoned salt, table salt, and sea salt. Worcestershire sauce. Tartar sauce. Barbecue sauce. Teriyaki sauce. Soy sauce, including reduced-sodium soy sauce. Steak sauce. Canned and packaged gravies. Fish sauce. Oyster sauce. Cocktail sauce. Store-bought horseradish. Ketchup. Mustard. Meat flavorings and tenderizers. Bouillon cubes. Hot sauces. Pre-made or packaged marinades. Pre-made or packaged taco seasonings. Relishes. Regular salad dressings. Other foods Salted popcorn and pretzels. The items listed above may not be all the foods and drinks you should avoid. Talk to a dietitian to learn more. Where to find more information National Heart, Lung, and Blood Institute (NHLBI): BuffaloDryCleaner.gl American Heart Association (AHA): heart.org Academy of Nutrition and Dietetics: eatright.org  National Kidney Foundation (NKF): kidney.org This information is not intended to replace advice given to you by your health care provider. Make sure you discuss any questions you have with your health care provider. Document Revised: 03/24/2022 Document Reviewed: 03/24/2022 Elsevier Patient Education  2024 [employer].

## 2023-11-03 NOTE — Progress Notes (Signed)
 Established Patient Visit  Patient: Corey Mcguire   DOB: 05-31-1960   63 y.o. Male  MRN: [id number] Visit Date: 11/03/2023  Subjective:    Chief Complaint  Patient presents with   Follow-up    4 week follow up for HTN  Refill on Sildenafil   Requested eye exam    HPI Essential hypertension BP not at goal with valsartan  160mg  and amlodipine  10mg  Not consistent BP check at home BP Readings from Last 3 Encounters:  11/03/23 (!) 142/80  09/25/23 (!) 160/88  03/03/23 138/72    Increase valsartan  dose to 320mg  Maintain other med dose Hold on viagra  refill till BP is at goal Advised to stop nicotine use and maintain low salt diet F/up in 2weeks (video appointment)  Wt Readings from Last 3 Encounters:  11/03/23 225 lb (102.1 kg)  09/25/23 216 lb 6.4 oz (98.2 kg)  03/03/23 204 lb (92.5 kg)    Reviewed medical, surgical, and social history today  Medications: Outpatient Medications Prior to Visit  Medication Sig   Accu-Chek Softclix Lancets lancets SMARTSIG:Topical   amLODipine  (NORVASC ) 10 MG tablet Take 1 tablet (10 mg total) by mouth at bedtime.   Blood Glucose Monitoring Suppl DEVI 1 each by Does not apply route in the morning, at noon, and at bedtime. May substitute to any manufacturer covered by patient's insurance.   Cholecalciferol (VITAMIN D3) 125 MCG (5000 UT) CAPS Take 1 capsule (5,000 Units total) by mouth daily.   famotidine  (PEPCID ) 20 MG tablet TAKE 1 TABLET BY MOUTH AT BEDTIME   fenofibrate  (TRICOR ) 145 MG tablet Take 1 tablet (145 mg total) by mouth daily.   folic acid  (FOLVITE ) 1 MG tablet Take 1 tablet (1 mg total) by mouth daily.   Glucose Blood (BLOOD GLUCOSE TEST STRIPS) STRP Inject 1 each into the skin daily before breakfast. May substitute to any manufacturer covered by patient's insurance.   ibuprofen  (ADVIL ) 600 MG tablet Take 1 tablet (600 mg total) by mouth daily with breakfast.   Lancets Misc. MISC 1 each by Does not apply route  daily before breakfast. May substitute to any manufacturer covered by patient's insurance.   metFORMIN  (GLUCOPHAGE -XR) 500 MG 24 hr tablet Take 1 tablet (500 mg total) by mouth daily with breakfast.   omeprazole  (PRILOSEC) 20 MG capsule TAKE 1 CAPSULE BY MOUTH EVERY MORNING   rosuvastatin  (CRESTOR ) 20 MG tablet Take 1 tablet (20 mg total) by mouth daily.   sildenafil  (VIAGRA ) 50 MG tablet TAKE 1 TABLET BY MOUTH AS NEEDED FOR ERECTILE DYSFUNCTION   [DISCONTINUED] Continuous Glucose Receiver (DEXCOM G7 RECEIVER) DEVI Use daily   [DISCONTINUED] Continuous Glucose Sensor (DEXCOM G7 SENSOR) MISC Change every 10days   [DISCONTINUED] Lancet Device MISC 1 each by Does not apply route daily. May substitute to any manufacturer covered by patient's insurance.   [DISCONTINUED] potassium chloride  SA (KLOR-CON  M) 20 MEQ tablet Take 1 tablet (20 mEq total) by mouth 2 (two) times daily.   [DISCONTINUED] valsartan  (DIOVAN ) 160 MG tablet TAKE 1 TABLET BY MOUTH DAILY; **MUST CALL MD FOR APPOINTMENT FOR ANY MORE REFILLS PLEASE   [DISCONTINUED] cyanocobalamin  (VITAMIN B12) 1000 MCG tablet Take 1 tablet (1,000 mcg total) by mouth daily. (Patient not taking: Reported on 09/25/2023)   No facility-administered medications prior to visit.   Reviewed past medical and social history.   ROS per HPI above      Objective:  BP (!) 142/80 (BP  Location: Left Arm, Patient Position: Sitting, Cuff Size: Large)   Pulse 76   Temp 98.4 F (36.9 C) (Oral)   Ht 5' 7 (1.702 m)   Wt 225 lb (102.1 kg)   SpO2 100%   BMI 35.24 kg/m      Physical Exam Vitals and nursing note reviewed.  Cardiovascular:     Rate and Rhythm: Normal rate.     Pulses: Normal pulses.  Pulmonary:     Effort: Pulmonary effort is normal.  Musculoskeletal:     Right lower leg: No edema.     Left lower leg: No edema.  Neurological:     Mental Status: He is alert and oriented to person, place, and time.     No results found for any visits on  11/03/23.    Assessment & Plan:    Problem List Items Addressed This Visit     Essential hypertension   BP not at goal with valsartan  160mg  and amlodipine  10mg  Not consistent BP check at home BP Readings from Last 3 Encounters:  11/03/23 (!) 142/80  09/25/23 (!) 160/88  03/03/23 138/72    Increase valsartan  dose to 320mg  Maintain other med dose Hold on viagra  refill till BP is at goal Advised to stop nicotine use and maintain low salt diet F/up in 2weeks (video appointment)       Relevant Medications   valsartan  (DIOVAN ) 320 MG tablet   Return in about 2 weeks (around 11/17/2023) for HTN.     Roselie Mood, NP

## 2023-11-03 NOTE — Assessment & Plan Note (Addendum)
 BP not at goal with valsartan  160mg  and amlodipine  10mg  Not consistent BP check at home BP Readings from Last 3 Encounters:  11/03/23 (!) 142/80  09/25/23 (!) 160/88  03/03/23 138/72    Increase valsartan  dose to 320mg  Maintain other med dose Hold on viagra  refill till BP is at goal Advised to stop nicotine use and maintain low salt diet F/up in 2weeks (video appointment)

## 2023-11-22 ENCOUNTER — Telehealth: Payer: Self-pay

## 2023-11-22 ENCOUNTER — Ambulatory Visit: Admitting: Nurse Practitioner

## 2023-11-22 ENCOUNTER — Encounter: Payer: Self-pay | Admitting: Nurse Practitioner

## 2023-11-22 NOTE — Telephone Encounter (Signed)
 Called patient to get ready for his virtual appointment and patient informed me that he saw that he stepped out of the meeting he currently was in at work that he was informed of at last minute. He asked to cancel today's appointment and reschedule that he will call back or asked that I give him a call later today about 4 or 4:30 to get him rescheduled.

## 2023-11-22 NOTE — Telephone Encounter (Signed)
 Tried calling patient for virtual video appointment. Left a voice message per DPR on file asking to give me a call back at the office at [phone]

## 2023-12-27 ENCOUNTER — Other Ambulatory Visit: Payer: Self-pay | Admitting: Nurse Practitioner

## 2023-12-27 DIAGNOSIS — Q6589 Other specified congenital deformities of hip: Secondary | ICD-10-CM

## 2024-01-22 ENCOUNTER — Encounter: Payer: Self-pay | Admitting: Radiology

## 2024-03-24 ENCOUNTER — Other Ambulatory Visit: Payer: Self-pay | Admitting: Nurse Practitioner

## 2024-03-24 DIAGNOSIS — E1169 Type 2 diabetes mellitus with other specified complication: Secondary | ICD-10-CM

## 2024-04-09 ENCOUNTER — Other Ambulatory Visit: Payer: Self-pay | Admitting: Nurse Practitioner

## 2024-04-09 DIAGNOSIS — I10 Essential (primary) hypertension: Secondary | ICD-10-CM

## 2024-04-09 DIAGNOSIS — K2100 Gastro-esophageal reflux disease with esophagitis, without bleeding: Secondary | ICD-10-CM

## 2024-04-09 DIAGNOSIS — E1169 Type 2 diabetes mellitus with other specified complication: Secondary | ICD-10-CM

## 2024-04-09 NOTE — Telephone Encounter (Signed)
 Called patient and left a voice message asking to give the office a call back to get scheduled for a follow up appointment.

## 2024-04-10 NOTE — Telephone Encounter (Signed)
 Medication: Amlodipine  10 mg Directions: Take 1 tablet by mouth daily Last Visit: 11/03/2023 Follow Up: 2 weeks for HTN Next Visit: Visit date not found Last Refill: 09/26/23 Follow up: Labs:09/25/23  Medication: Fenfibrate 145 mg  Directions: Take 1 tablet by mouth daily  Last Visit: 11/03/2023 Follow Up: 2 weeks for HTN Next Visit: Visit date not found Last Refill: 09/26/23 Labs:09/25/23  Medication: Omeprazole  20 mg  Directions: Take 1 capsule by mouth every morning Last Visit: 11/03/2023 Follow Up: 2 weeks for HTN Next Visit: Visit date not found Last Refill: 10/09/23 Labs:09/25/23  Medication: Rosuvastatin  20 mg  Directions: Take 1 tablet by mouth daily  Last Visit: 11/03/2023 Follow Up: 2 weeks for HTN Next Visit: Visit date not found Last Refill: 09/26/23 Labs:09/25/23
